# Patient Record
Sex: Female | Born: 2000 | Race: White | Hispanic: No | Smoking: Never smoker
Health system: Southern US, Community
[De-identification: ages and names within clinical notes are randomized; demographics above are authoritative.]

## PROBLEM LIST (undated history)

## (undated) DIAGNOSIS — L309 Dermatitis, unspecified: Secondary | ICD-10-CM

## (undated) DIAGNOSIS — J45909 Unspecified asthma, uncomplicated: Secondary | ICD-10-CM

## (undated) HISTORY — PX: TYMPANOSTOMY TUBE PLACEMENT: SHX32

## (undated) HISTORY — PX: ADENOIDECTOMY: SUR15

## (undated) HISTORY — PX: OTHER SURGICAL HISTORY: SHX169

## (undated) HISTORY — DX: Dermatitis, unspecified: L30.9

## (undated) HISTORY — PX: TONSILLECTOMY: SUR1361

---
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
## 2018-01-10 ENCOUNTER — Other Ambulatory Visit: Payer: Self-pay | Admitting: Women's Health

## 2018-01-31 ENCOUNTER — Ambulatory Visit (INDEPENDENT_AMBULATORY_CARE_PROVIDER_SITE_OTHER): Payer: No Typology Code available for payment source

## 2018-01-31 DIAGNOSIS — J309 Allergic rhinitis, unspecified: Secondary | ICD-10-CM | POA: Diagnosis not present

## 2018-02-07 ENCOUNTER — Ambulatory Visit (INDEPENDENT_AMBULATORY_CARE_PROVIDER_SITE_OTHER): Payer: Medicaid Other | Admitting: *Deleted

## 2018-02-07 DIAGNOSIS — J309 Allergic rhinitis, unspecified: Secondary | ICD-10-CM

## 2018-02-14 ENCOUNTER — Encounter: Payer: Self-pay | Admitting: Allergy & Immunology

## 2018-02-14 ENCOUNTER — Ambulatory Visit (INDEPENDENT_AMBULATORY_CARE_PROVIDER_SITE_OTHER): Payer: Medicaid Other | Admitting: Allergy & Immunology

## 2018-02-14 VITALS — BP 110/80 | HR 78 | Temp 97.8°F | Resp 18 | Ht 62.25 in | Wt 155.8 lb

## 2018-02-14 DIAGNOSIS — J309 Allergic rhinitis, unspecified: Secondary | ICD-10-CM

## 2018-02-14 DIAGNOSIS — J302 Other seasonal allergic rhinitis: Secondary | ICD-10-CM

## 2018-02-14 DIAGNOSIS — J3089 Other allergic rhinitis: Secondary | ICD-10-CM

## 2018-02-14 DIAGNOSIS — J4540 Moderate persistent asthma, uncomplicated: Secondary | ICD-10-CM

## 2018-02-14 MED ORDER — BECLOMETHASONE DIPROPIONATE 80 MCG/ACT IN AERS: 2.0000 | INHALATION_SPRAY | Freq: Two times a day (BID) | RESPIRATORY_TRACT | 5 refills | Status: DC

## 2018-02-14 MED ORDER — ALBUTEROL SULFATE HFA 108 (90 BASE) MCG/ACT IN AERS: 2.0000 | INHALATION_SPRAY | Freq: Four times a day (QID) | RESPIRATORY_TRACT | 1 refills | Status: DC | PRN

## 2018-02-14 NOTE — Progress Notes (Signed)
FOLLOW UP  Date of Service/Encounter:  02/14/18   Assessment:   Moderate persistent asthma, uncomplicated  Seasonal and perennial allergic rhinitis (grasses, weeds, trees, molds, dust mites, cat, dog)  [place]   Asthma Reportables:  Severity: moderate persistent  Risk: low Control: well controlled   Plan/Recommendations:   1. Moderate persistent asthma, uncomplicated - Lung testing looks great today. - We will not make any changes since you are doing so well.  - Daily controller medication(s): Qvar Redihaler 2 puffs twice daily - Prior to physical activity: ProAir 2 puffs 10-15 minutes before physical activity. - Rescue medications: ProAir 4 puffs every 4-6 hours as needed - Changes during respiratory infections or worsening symptoms: Increase Qvar to 4 puffs twice daily for TWO WEEKS. - Asthma control goals:  * Full participation in all desired activities (may need albuterol before activity) * Albuterol use two time or less a week on average (not counting use with activity) * Cough interfering with sleep two time or less a month * Oral steroids no more than once a year * No hospitalizations  2. Seasonal and perennial allergic rhinitis (grasses, weeds, trees, molds, dust mites, cat, dog) - Continue with allergy shots at the same schedule. - Try to come weekly so that we can make it up to a more efficacious dose before the spring time hits.  - Continue with Xyzal 5mg  daily (you can take an extra one on particularly bad days). - Continue with fluticasone nasal spray one spray per nostril daily. - Restart the montelukast to see if this helps with the mucous production and nasal congestion.  - Start the prednisone pack if there is no improvement in your symptoms.   3. Return in about 6 months (around 08/17/2018).   Subjective:   Zoe House is a 17 y.o. female presenting today  for follow up of  Chief Complaint  Patient presents with  . Follow-up    Asthma     Zoe House has a history of the following: Patient Active Problem List   Diagnosis Date Noted  . Moderate persistent asthma, uncomplicated 05/17/2017  . Seasonal and perennial allergic rhinitis 05/17/2017  . Asthma 11/12/2015    History obtained from: chart review and patient.  Zoe House's Primary Care Provider is Assunta Found, MD.     Zoe House is a 17 y.o. female presenting for a follow up visit.  She was last seen in February 2019 for an office visit.  At that time, her lung testing looked great.  We continued her on Qvar 80 mcg 2 puffs twice daily, increasing to 4 puffs twice daily during flares.  She was doing well on allergy shots for her seasonal and perennial allergic rhinitis.  We continued Xyzal 5 mg daily and Flonase 1 spray per nostril daily.  Since the last visit, she has done fairly well.  However, she did go several months without getting any allergy shots and remains in her blue vial. She is having some insurance coverage issues and she is checking on that. She was told that [facility] [employer] would cover her until her next birthday. She is also having quite a hard time with getting to and from both her medical visits as well as her part time job at a [employer]. She did have an easier time when she was living in [place], but she has now moved [place] a place outside of the  city between here and [place]. Now she is relying on both friends and family to drive her around.   Asthma/Respiratory Symptom History: She has done fairly well since the last visit. She reports feeling wheezy today. She is unsure what triggered this, but she recently moved in with her boyfriend. There are no new exposures but they are getting a kitten soon. The place they moved into needs some cleaning, so she thinks that this is where her wheezing is coming from. She remains on the Qvar two puffs twice daily. Zoe House's  asthma has been well controlled. She has not required rescue medication, experienced nocturnal awakenings due to lower respiratory symptoms, nor have activities of daily living been limited. She has required no Emergency Department or Urgent Care visits for her asthma. She has required zero courses of systemic steroids for asthma exacerbations since the last visit. ACT score today is 21, indicating excellent asthma symptom control.   Allergic Rhinitis Symptom History: Zoe AddisonKatie is on allergen immunotherapy. She receives two injections. Immunotherapy script #1 contains trees, weeds, grasses, cat and dog. She currently receives 0.3615mL of the BLUE vial (1/100,000). Immunotherapy script #2 contains ragweed, molds and dust mites. She currently receives 0.7515mL of the BLUE vial (1/100,000). She started shots in November of 2018 and not yet reached maintenance. She remains on Xyzal 5mg  daily. She is continuing to work on transportation, so this is an issue for her. She is planning to get rides from other people. She has looked into [employer] but she does not have the $400 for the classes/registration.     Otherwise, there have been no changes to her past medical history, surgical history, family history, or social history. She has recently moved and is getting adjusted to her current living situation. Her boyfriend works in [employer] and makes a decent living.   Review of Systems: a 14-point review of systems is pertinent for what is mentioned in HPI.  Otherwise, all other systems were negative. Constitutional: negative other than that listed in the HPI Eyes: negative other than that listed in the HPI Ears, nose, mouth, throat, and face: negative other than that listed in the HPI Respiratory: negative other than that listed in the HPI Cardiovascular: negative other than that listed in the HPI Gastrointestinal: negative other than that listed in the HPI Genitourinary: negative other than that listed in the  HPI Integument: negative other than that listed in the HPI Hematologic: negative other than that listed in the HPI Musculoskeletal: negative other than that listed in the HPI Neurological: negative other than that listed in the HPI Allergy/Immunologic: negative other than that listed in the HPI    Objective:   Blood pressure 110/80, pulse 78, temperature 97.8 F (36.6 C), temperature source Oral, resp. rate 18, height 5' 2.25" (1.581 m), weight 155 lb 12.8 oz (70.7 kg), SpO2 98 %, not currently breastfeeding. Body mass index is 28.27 kg/m.   Physical Exam:  General: Alert, interactive, in no acute distress. Pleasant female. Talkative. Shows me pictures of her adorable daughter.  Eyes: No conjunctival injection bilaterally, no discharge on the right, no discharge on the left and no Horner-Trantas dots present. PERRL bilaterally. EOMI without pain. No photophobia.  Ears: Scarring on the right TM, Right TM pearly gray with normal light reflex, Left TM pearly gray with normal light reflex, Right TM intact without perforation and Left TM intact without perforation.  Nose/Throat: External nose within normal limits, nasal crease present and septum midline. Turbinates edematous with clear discharge.  Posterior oropharynx erythematous without cobblestoning in the posterior oropharynx. Tonsils 2+ without exudates.  Tongue without thrush. Lungs: Clear to auscultation without wheezing, rhonchi or rales. No increased work of breathing. CV: Normal S1/S2. No murmurs. Capillary refill <2 seconds.  Skin: Warm and dry, without lesions or rashes. Neuro:   Grossly intact. No focal deficits appreciated. Responsive to questions.  Diagnostic studies:   Spirometry: results normal (FEV1: 2.48/112%, FVC: 3.10/116%, FEV1/FVC: 79%).    Spirometry consistent with normal pattern.   Allergy Studies: none       Malachi BondsJoel Caige Almeda, MD  [facility] [place]

## 2018-02-14 NOTE — Patient Instructions (Addendum)
1. Moderate persistent asthma, uncomplicated - Lung testing looks great today. - We will not make any changes since you are doing so well.  - Daily controller medication(s): Qvar 80mcg Redihaler 2 puffs twice daily - Prior to physical activity: ProAir 2 puffs 10-15 minutes before physical activity. - Rescue medications: ProAir 4 puffs every 4-6 hours as needed - Changes during respiratory infections or worsening symptoms: Increase Qvar 80mcg to 4 puffs twice daily for TWO WEEKS. - Asthma control goals:  * Full participation in all desired activities (may need albuterol before activity) * Albuterol use two time or less a week on average (not counting use with activity) * Cough interfering with sleep two time or less a month * Oral steroids no more than once a year * No hospitalizations  2. Seasonal and perennial allergic rhinitis (grasses, weeds, trees, molds, dust mites, cat, dog) - Continue with allergy shots at the same schedule. - Try to come weekly so that we can make it up to a more efficacious dose before the spring time hits.  - Continue with Xyzal 5mg  daily (you can take an extra one on particularly bad days). - Continue with fluticasone nasal spray one spray per nostril daily. - Restart the montelukast to see if this helps with the mucous production and nasal congestion.  - Start the prednisone pack if there is no improvement in your symptoms.   3. Return in about 6 months (around 08/17/2018).   Please inform us of any Emergency Department visits, hospitalizations, or changes in symptoms. Call us before going to the ED for breathing or allergy symptoms since we might be able to fit you in for a sick visit. Feel free to contact us anytime with any questions, problems, or concerns.  It was a pleasure to see you again today!  Websites that have reliable patient information: 1. American Academy of Asthma, Allergy, and Immunology: www.aaaai.org 2. Food Allergy Research and Education  (FARE): foodallergy.org 3. Mothers of Asthmatics: http://www.asthmacommunitynetwork.org 4. American College of Allergy, Asthma, and Immunology: www.acaai.org

## 2018-03-30 ENCOUNTER — Encounter: Payer: Self-pay | Admitting: Women's Health

## 2018-03-30 ENCOUNTER — Ambulatory Visit (INDEPENDENT_AMBULATORY_CARE_PROVIDER_SITE_OTHER): Payer: Medicaid Other | Admitting: Women's Health

## 2018-03-30 ENCOUNTER — Encounter (INDEPENDENT_AMBULATORY_CARE_PROVIDER_SITE_OTHER): Payer: Self-pay

## 2018-03-30 VITALS — BP 128/77 | HR 83 | Ht 62.0 in | Wt 156.5 lb

## 2018-03-30 DIAGNOSIS — Z309 Encounter for contraceptive management, unspecified: Secondary | ICD-10-CM | POA: Diagnosis not present

## 2018-03-30 DIAGNOSIS — J45909 Unspecified asthma, uncomplicated: Secondary | ICD-10-CM

## 2018-03-30 DIAGNOSIS — Z113 Encounter for screening for infections with a predominantly sexual mode of transmission: Secondary | ICD-10-CM | POA: Diagnosis not present

## 2018-03-30 DIAGNOSIS — Z01419 Encounter for gynecological examination (general) (routine) without abnormal findings: Secondary | ICD-10-CM | POA: Diagnosis not present

## 2018-03-30 DIAGNOSIS — Z3202 Encounter for pregnancy test, result negative: Secondary | ICD-10-CM | POA: Diagnosis not present

## 2018-03-30 DIAGNOSIS — Z3009 Encounter for other general counseling and advice on contraception: Secondary | ICD-10-CM

## 2018-03-30 DIAGNOSIS — R062 Wheezing: Secondary | ICD-10-CM

## 2018-03-30 LAB — POCT URINE PREGNANCY: Preg Test, Ur: NEGATIVE

## 2018-03-30 MED ORDER — MOMETASONE FUROATE 0.1 % EX CREA: TOPICAL_CREAM | CUTANEOUS | 5 refills | Status: DC

## 2018-03-30 MED ORDER — NORETHIN-ETH ESTRAD-FE BIPHAS 1 MG-10 MCG / 10 MCG PO TABS: 1.0000 | ORAL_TABLET | Freq: Every day | ORAL | 3 refills | Status: DC

## 2018-03-30 MED ORDER — LEVOCETIRIZINE DIHYDROCHLORIDE 5 MG PO TABS: 5.0000 mg | ORAL_TABLET | Freq: Every day | ORAL | 5 refills | Status: DC

## 2018-03-30 NOTE — Progress Notes (Signed)
   WELL-WOMAN EXAMINATION Patient name: Zoe House MRN [id number]  Date of birth: [birth year] Chief Complaint:   Annual Exam (Family Planning; needs refill on birth control)  History of Present Illness:   Zoe House is a 17 y.o. G17P1001 Caucasian female being seen today for a routine [facility] well-woman exam.  Current complaints: needs refill on COCs, [facility] just got switched to [facility] Mcaid so not able to see allergist/asthma specialist, needs refills on Xyzal and Elocon cream for eczema, was unable to get inhalers (albuterol and Qvar) as [facility] doesn't pay for them  PCP: [facility]      No LMP recorded. The current method of family planning is OCP (estrogen/progesterone) Last pap <21yo. Results were: n/a Last mammogram: never. Results were: n/a Last colonoscopy: never. Results were: n/a  Review of Systems:   Pertinent items are noted in HPI Denies any headaches, blurred vision, fatigue, shortness of breath, chest pain, abdominal pain, abnormal vaginal discharge/itching/odor/irritation, problems with periods, bowel movements, urination, or intercourse unless otherwise stated above. Pertinent History Reviewed:  Reviewed past medical,surgical, social and family history.  Reviewed problem list, medications and allergies. Physical Assessment:   Vitals:   03/30/18 1500  BP: 128/77  Pulse: 83  Weight: 156 lb 8 oz (71 kg)  Height: 5\' 2"  (1.575 m)  Body mass index is 28.62 kg/m.        Physical Examination:   General appearance - well appearing, and in no distress  Mental status - alert, oriented to person, place, and time  Psych:  She has a normal mood and affect  Skin - warm and dry, normal color, no suspicious lesions noted  Chest - effort normal, exp wheezing Lt upper & lower lobe, Rt side normal  Heart - normal rate and regular rhythm  Neck:  midline trachea, no thyromegaly or nodules  Breasts - breasts appear normal, no suspicious masses, no skin or nipple changes or  axillary  nodes  Abdomen - soft, nontender, nondistended, no masses or organomegaly  Pelvic - VULVA: normal appearing vulva with no masses, tenderness or lesions  VAGINA: normal appearing vagina with normal color and discharge, no lesions  CERVIX: normal appearing cervix without discharge or lesions, no CMT  Thin prep pap is not done   UTERUS: uterus is felt to be normal size, shape, consistency and nontender   ADNEXA: No adnexal masses or tenderness noted.  Extremities:  No swelling or varicosities noted  Results for orders placed or performed in visit on 03/30/18 (from the past 24 hour(s))  POCT urine pregnancy   Collection Time: 03/30/18  3:20 PM  Result Value Ref Range   Preg Test, Ur Negative Negative    Assessment & Plan:  1) [facility] Well-Woman Exam  2) STD screen  3) Contraception management>refilled LoLoestrin  4) Asthma w/ expiratory wheezing> Lt upper/lower lobes, gave GoodRx and Pharmacy savings card to try so she is able to get inhalers (Qvar and albuterol) filled. Also refilled her Xyzal and Elocon   Labs/procedures today: gc/ct, hiv, rpr  Mammogram @19yo  or sooner if problems Colonoscopy @17yo  or sooner if problems  Orders Placed This Encounter  Procedures  . GC/Chlamydia Probe Amp  . HIV Antibody (routine testing w rflx)  . RPR  . POCT urine pregnancy    Follow-up: Return in about 1 year (around 03/31/2019) for Physical.  Cheral Marker CNM, [facility] 03/30/2018 3:34 PM

## 2018-03-31 LAB — HIV ANTIBODY (ROUTINE TESTING W REFLEX): HIV Screen 4th Generation wRfx: NONREACTIVE

## 2018-03-31 LAB — RPR: RPR Ser Ql: NONREACTIVE

## 2018-04-01 LAB — GC/CHLAMYDIA PROBE AMP
Chlamydia trachomatis, NAA: NEGATIVE
NEISSERIA GONORRHOEAE BY PCR: NEGATIVE

## 2018-08-15 ENCOUNTER — Ambulatory Visit: Payer: Self-pay | Admitting: Allergy & Immunology

## 2018-08-16 ENCOUNTER — Ambulatory Visit: Payer: Self-pay | Admitting: Allergy & Immunology

## 2018-10-16 ENCOUNTER — Other Ambulatory Visit: Payer: Self-pay

## 2018-10-16 MED ORDER — ALBUTEROL SULFATE HFA 108 (90 BASE) MCG/ACT IN AERS: 2.0000 | INHALATION_SPRAY | Freq: Four times a day (QID) | RESPIRATORY_TRACT | 0 refills | Status: DC | PRN

## 2018-10-16 MED ORDER — FLUTICASONE PROPIONATE HFA 110 MCG/ACT IN AERO: 2.0000 | INHALATION_SPRAY | Freq: Two times a day (BID) | RESPIRATORY_TRACT | 5 refills | Status: DC

## 2018-10-16 MED ORDER — BECLOMETHASONE DIPROPIONATE 80 MCG/ACT IN AERS: 2.0000 | INHALATION_SPRAY | Freq: Two times a day (BID) | RESPIRATORY_TRACT | 0 refills | Status: DC

## 2018-10-16 NOTE — Addendum Note (Signed)
Addended by: Mliss Fritz I on: 10/16/2018 02:16 PM   Modules accepted: Orders

## 2018-10-16 NOTE — Telephone Encounter (Signed)
Patient called and finally has her MCD Back. She made a Televisit for Friday with Dr Dellis Anes. Patient is wheezing, I could hear her over the phone. She has been without her inhalers due to not having insurance. She did find an inhaler at home but she is unsure if its even the correct one to take, but its not helping her. I told her to make sure she keeps her televisit on Friday, patient agreed she would.   Please send refill to [facility].

## 2018-10-16 NOTE — Telephone Encounter (Signed)
I spoke with patient and informed her of the change in medication from Qvar to Flovent. Per MD standing orders Qvar HFA (not redihaler) can be changed to Flovent equivalent.

## 2018-10-20 ENCOUNTER — Ambulatory Visit (INDEPENDENT_AMBULATORY_CARE_PROVIDER_SITE_OTHER): Payer: Medicaid Other | Admitting: Allergy & Immunology

## 2018-10-20 ENCOUNTER — Other Ambulatory Visit: Payer: Self-pay

## 2018-10-20 ENCOUNTER — Encounter: Payer: Self-pay | Admitting: Allergy & Immunology

## 2018-10-20 DIAGNOSIS — J302 Other seasonal allergic rhinitis: Secondary | ICD-10-CM

## 2018-10-20 DIAGNOSIS — J3089 Other allergic rhinitis: Secondary | ICD-10-CM | POA: Diagnosis not present

## 2018-10-20 DIAGNOSIS — J4540 Moderate persistent asthma, uncomplicated: Secondary | ICD-10-CM | POA: Diagnosis not present

## 2018-10-20 MED ORDER — LEVOCETIRIZINE DIHYDROCHLORIDE 5 MG PO TABS: 5.0000 mg | ORAL_TABLET | Freq: Every day | ORAL | 5 refills | Status: DC

## 2018-10-20 MED ORDER — MONTELUKAST SODIUM 10 MG PO TABS: 10.0000 mg | ORAL_TABLET | Freq: Every day | ORAL | 11 refills | Status: DC

## 2018-10-20 NOTE — Patient Instructions (Signed)
1. Moderate persistent asthma, uncomplicated - We replaced the Qvar with Flovent since Medicaid only covers this now.  - Daily controller medication(s): Flovent 2 puffs twice daily with spacer - Prior to physical activity: ProAir 2 puffs 10-15 minutes before physical activity. - Rescue medications: ProAir 4 puffs every 4-6 hours as needed - Changes during respiratory infections or worsening symptoms: Increase Flovent to 4 puffs twice daily for TWO WEEKS. - Asthma control goals:  * Full participation in all desired activities (may need albuterol before activity) * Albuterol use two time or less a week on average (not counting use with activity) * Cough interfering with sleep two time or less a month * Oral steroids no more than once a year * No hospitalizations  2. Seasonal and perennial allergic rhinitis (grasses, weeds, trees, molds, dust mites, cat, dog) - Restart the montelukast since this is cheap and can help with the asthma as well as the allergies.  - Continue with Xyzal 5mg  daily (you can take an extra one on particularly bad days). - Continue with fluticasone nasal spray one spray per nostril daily.  3. No follow-ups on file. This can be an in-person, a virtual Webex or a telephone follow up visit.   Please inform us of any Emergency Department visits, hospitalizations, or changes in symptoms. Call us before going to the ED for breathing or allergy symptoms since we might be able to fit you in for a sick visit. Feel free to contact us anytime with any questions, problems, or concerns.  It was a pleasure to talk to you today today!  Websites that have reliable patient information: 1. American Academy of Asthma, Allergy, and Immunology: www.aaaai.org 2. Food Allergy Research and Education (FARE): foodallergy.org 3. Mothers of Asthmatics: http://www.asthmacommunitynetwork.org 4. American College of Allergy, Asthma, and Immunology: www.acaai.org  "Like" [place] on  Facebook and Instagram for our latest updates!      Make sure you are registered to vote! If you have moved or changed any of your contact information, you will need to get this updated before voting!    Voter ID laws are NOT going into effect for the General Election in November 2020! DO NOT let this stop you from exercising your right to vote!

## 2018-10-20 NOTE — Progress Notes (Signed)
Start:1110 AM Location home End 11:26 AM

## 2018-10-20 NOTE — Progress Notes (Signed)
RE: Zoe House MRN: [id number] DOB: 09-08-2000 Date of Telemedicine Visit: 10/20/2018  Referring provider: Assunta Found, MD Primary care provider: Assunta Found, MD  Chief Complaint: Asthma and Allergic Rhinitis    Telemedicine Follow Up Visit via Telephone: I connected with Zoe House for a follow up on 10/20/18 by telephone and verified that I am speaking with the correct person using two identifiers.   I discussed the limitations, risks, security and privacy concerns of performing an evaluation and management service by telephone and the availability of in person appointments. I also discussed with the patient that there may be a patient responsible charge related to this service. The patient expressed understanding and agreed to proceed.  Patient is at home accompanied by her two year old daughter.  Provider is at the office.  Visit start time: 11:10 AM Visit end time: 11:26 AM Insurance consent/check in by: [facility] Medical consent and medical assistant/nurse: Zoe House  History of Present Illness:  She is a 18 y.o. female, who is being followed for persistent asthma and allergic rhinitis. Her previous allergy office visit was in August 2019 with Dr. Dellis Anes.  She was last seen in August 2019.  At that time, her lung testing looked great.  We did not make any medication changes since she was doing so well.  We continued her on Qvar 80 mcg 2 puffs twice daily with albuterol as needed.  For her allergic rhinitis, we continued her with allergy shots.  Travel had been an issue, but she had access to more reliable transportation and was interested in doing the allergy shots.  We continued levo cetirizine 5 mg daily and fluticasone 1 spray per nostril daily.  We also restarted her on the montelukast.  Since last visit, she has not done very well.  Unfortunately, she lost her insurance when she was laid off in mid March.  She has now qualified for Medicaid, but she is unsure how long this  will last.  Asthma/Respiratory Symptom History: We changed her Qvar to Flovent 2 puffs twice daily due to her Medicaid coverage.  She feels much better when she started this.  She originally called around 3 days ago and was wheezing on the phone, requesting refills.  She did not need prednisone and when she started the Qvar she felt quite good.  She is not on the montelukast.  We did restart that at the last visit, but she was under the impression to remain off of it.  Allergic Rhinitis Symptom History: She is interested in restarting her allergy shots, but transportation is an issue once again.  She is also unsure of her insurance status in the coming months.  She would like a refill of her Xyzal.  She does have no spray leftover.  Again, she has not taken montelukast.  She has not required antibiotics since last visit.  Otherwise, there have been no changes to her past medical history, surgical history, family history, or social history.  She is trying to stay safe at home with her now 23-year-old daughter.  Her daughter is very advanced and is speaking in full sentences, naming colors, and meeting all of her milestones.  Assessment and Plan:  Zoe House is a 18 y.o. female with:  Moderate persistent asthma, uncomplicated  Seasonal and perennial allergic rhinitis(grasses, weeds, trees, molds, dust mites, cat, dog)    1. Moderate persistent asthma, uncomplicated - We replaced the Qvar with Flovent since Medicaid only covers this now.  - Daily controller medication(s):  Flovent 110mcg 2 puffs twice daily with spacer - Prior to physical activity: ProAir 2 puffs 10-15 minutes before physical activity. - Rescue medications: ProAir 4 puffs every 4-6 hours as needed - Changes during respiratory infections or worsening symptoms: Increase Flovent 110mcg to 4 puffs twice daily for TWO WEEKS. - Asthma control goals:  * Full participation in all desired activities (may need albuterol before activity) *  Albuterol use two time or less a week on average (not counting use with activity) * Cough interfering with sleep two time or less a month * Oral steroids no more than once a year * No hospitalizations  2. Seasonal and perennial allergic rhinitis (grasses, weeds, trees, molds, dust mites, cat, dog) - Restart the montelukast since this is cheap and can help with the asthma as well as the allergies.  - Continue with Xyzal 5mg  daily (you can take an extra one on particularly bad days). - Continue with fluticasone nasal spray one spray per nostril daily.  3. Follow up in six months or earlier if needed. This can be an in-person, a virtual Webex or a telephone follow up visit.   Diagnostics: None.  Medication List:  Current Outpatient Medications  Medication Sig Dispense Refill  . albuterol (PROVENTIL HFA) 108 (90 Base) MCG/ACT inhaler Inhale 2 puffs into the lungs every 6 (six) hours as needed. 1 Inhaler 0  . fluticasone (FLOVENT HFA) 110 MCG/ACT inhaler Inhale 2 puffs into the lungs 2 (two) times daily. 1 Inhaler 5  . levocetirizine (XYZAL) 5 MG tablet Take 1 tablet (5 mg total) by mouth daily. 30 tablet 5  . mometasone (ELOCON) 0.1 % cream APPLY TO THE AFFECTED AREAS TWICE DAILY AS NEEDED FOR ECZEMA. 45 g 5  . Norethindrone-Ethinyl Estradiol-Fe Biphas (LO LOESTRIN FE) 1 MG-10 MCG / 10 MCG tablet Take 1 tablet by mouth daily. 3 Package 3  . montelukast (SINGULAIR) 10 MG tablet Take 1 tablet (10 mg total) by mouth at bedtime for 30 days. 30 tablet 11   No current facility-administered medications for this visit.    Allergies: Allergies  Allergen Reactions  . Other Other (See Comments)    Pt states that she is allergic to trees, dogs, cats, mold, grass, dust, and 52 other outside sources.  Pt states that she is NOT allergic to any kind of medication or food.     I reviewed her past medical history, social history, family history, and environmental history and no significant changes have  been reported from previous visits.  Review of Systems  Constitutional: Negative for activity change, appetite change, chills, fatigue and fever.  HENT: Positive for congestion and postnasal drip. Negative for nosebleeds, rhinorrhea and sinus pain.   Eyes: Negative for pain, discharge, redness and itching.  Respiratory: Positive for wheezing. Negative for cough and shortness of breath.   Gastrointestinal: Negative for constipation, diarrhea and nausea.  Endocrine: Negative for cold intolerance and heat intolerance.  Allergic/Immunologic: Negative for environmental allergies and food allergies.    Objective:  Physical exam not obtained as encounter was done via telephone.   Previous notes and tests were reviewed.  I discussed the assessment and treatment plan with the patient. The patient was provided an opportunity to ask questions and all were answered. The patient agreed with the plan and demonstrated an understanding of the instructions.   The patient was advised to call back or seek an in-person evaluation if the symptoms worsen or if the condition fails to improve as anticipated.  I  provided 16 minutes of non-face-to-face time during this encounter.  It was my pleasure to participate in [place] Blucher's care today. Please feel free to contact me with any questions or concerns.   Sincerely,  Alfonse Spruce, MD

## 2019-03-01 ENCOUNTER — Telehealth: Payer: Self-pay | Admitting: Women's Health

## 2019-03-01 ENCOUNTER — Other Ambulatory Visit: Payer: Self-pay | Admitting: Advanced Practice Midwife

## 2019-03-01 ENCOUNTER — Other Ambulatory Visit: Payer: Self-pay | Admitting: Women's Health

## 2019-03-01 NOTE — Telephone Encounter (Signed)
Patient called, requesting a refill on her bc.  [place]

## 2019-03-01 NOTE — Telephone Encounter (Signed)
Pt needs a refill on her birth control. Thanks! JSY 

## 2019-04-20 ENCOUNTER — Other Ambulatory Visit: Payer: Self-pay

## 2019-04-20 ENCOUNTER — Encounter: Payer: Self-pay | Admitting: Allergy & Immunology

## 2019-04-20 ENCOUNTER — Ambulatory Visit (INDEPENDENT_AMBULATORY_CARE_PROVIDER_SITE_OTHER): Payer: Medicaid Other | Admitting: Allergy & Immunology

## 2019-04-20 VITALS — BP 108/62 | HR 74 | Temp 98.6°F | Resp 16 | Ht 63.31 in | Wt 156.2 lb

## 2019-04-20 DIAGNOSIS — J302 Other seasonal allergic rhinitis: Secondary | ICD-10-CM | POA: Diagnosis not present

## 2019-04-20 DIAGNOSIS — J3089 Other allergic rhinitis: Secondary | ICD-10-CM

## 2019-04-20 DIAGNOSIS — J4530 Mild persistent asthma, uncomplicated: Secondary | ICD-10-CM

## 2019-04-20 MED ORDER — FLOVENT HFA 110 MCG/ACT IN AERO: 2.0000 | INHALATION_SPRAY | Freq: Two times a day (BID) | RESPIRATORY_TRACT | 5 refills | Status: DC

## 2019-04-20 MED ORDER — MONTELUKAST SODIUM 10 MG PO TABS: 10.0000 mg | ORAL_TABLET | Freq: Every day | ORAL | 11 refills | Status: DC

## 2019-04-20 MED ORDER — LEVOCETIRIZINE DIHYDROCHLORIDE 5 MG PO TABS: 5.0000 mg | ORAL_TABLET | Freq: Every day | ORAL | 5 refills | Status: DC

## 2019-04-20 MED ORDER — FLUTICASONE PROPIONATE 50 MCG/ACT NA SUSP: 2.0000 | Freq: Every day | NASAL | 5 refills | Status: DC

## 2019-04-20 NOTE — Progress Notes (Signed)
FOLLOW UP  Date of Service/Encounter:  04/20/19   Assessment:   Moderate persistent asthma, uncomplicated  Seasonal and perennial allergic rhinitis(grasses, weeds, trees, molds, dust mites, cat, dog)  Plan/Recommendations:   1. Moderate persistent asthma, uncomplicated - Lung testing looks great today. - Daily controller medication(s): Flovent 2 puffs twice daily with spacer - Prior to physical activity: ProAir 2 puffs 10-15 minutes before physical activity. - Rescue medications: ProAir 4 puffs every 4-6 hours as needed - Changes during respiratory infections or worsening symptoms: Increase Flovent to 4 puffs twice daily for TWO WEEKS. - Asthma control goals:  * Full participation in all desired activities (may need albuterol before activity) * Albuterol use two time or less a week on average (not counting use with activity) * Cough interfering with sleep two time or less a month * Oral steroids no more than once a year * No hospitalizations  2. Seasonal and perennial allergic rhinitis (grasses, weeds, trees, molds, dust mites, cat, dog) - Continue with montelukast.   - Continue with Xyzal 5mg  daily (you can take an extra one on particularly bad days). - Continue with fluticasone nasal spray one spray per nostril daily. - Make an appointment to restart your allergy shots in two weeks.   3. Return in about 6 months (around 10/19/2019). This can be an in-person, a virtual Webex or a telephone follow up visit.   Subjective:   Zoe House is a 18 y.o. female presenting today for follow up of  Chief Complaint  Patient presents with  . Allergic Rhinitis     doing good refill on xyzal    Zoe House has a history of the following: Patient Active Problem List   Diagnosis Date Noted  . Moderate persistent asthma, uncomplicated 05/17/2017  . Seasonal and perennial allergic rhinitis 05/17/2017  . Asthma 11/12/2015    History obtained from: chart  review and patient.  Zoe House is a 18 y.o. female presenting for a follow up visit.  Last spoke in April 2020 via telephone visit.  At that time, we replaced her Qvar with Flovent.  We prescribed Flovent 110 mcg 2 puffs twice daily with a spacer.  We also continued albuterol as needed.  For her allergic rhinitis, we restarted the montelukast.  We continued Xyzal 5 mg daily as well as Flonase 1 spray per nostril daily.  Since last visit, she has done well. 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 They are having a hard time at home, due mostly to the fact that her boyfriend's work has decreased. He apparently puts on stone work at [employer]. She is fortunate to be able to qualify for Medicaid, however, so while she has it she would like to start her allergy shots once again. Transportation has been an issue in the past, but this is no longer a problem.   Asthma/Respiratory Symptom History: She remains on the Flovent BID. She does have the albuterol which she is not using very often at all. Typically she does not need to use it. She used it prior to getting her wisdom teeth removed last week.   Allergic Rhinitis Symptom History: She is using the montelukast daily. She is interested in starting allergy shots. She is on amoxicillin because of her wisdom tooth removal.   Otherwise, there have been no changes to her past medical history, surgical history, family history, or social history.  Review of Systems  Constitutional: Negative.  Negative for chills, fever, malaise/fatigue and weight loss.  HENT: Negative.  Negative for congestion, ear discharge and ear pain.   Eyes: Negative for pain, discharge and redness.  Respiratory: Positive for cough and sputum production. Negative for shortness of breath and wheezing.   Cardiovascular: Negative.  Negative for chest pain and palpitations.  Gastrointestinal: Negative for  abdominal pain, heartburn, nausea and vomiting.  Skin: Negative.  Negative for itching and rash.  Neurological: Negative for dizziness and headaches.  Endo/Heme/Allergies: Positive for environmental allergies. Does not bruise/bleed easily.       Objective:   Blood pressure 108/62, pulse 74, temperature 98.6 F (37 C), temperature source Temporal, resp. rate 16, height 5' 3.31" (1.608 m), weight 156 lb 3.2 oz (70.9 kg), SpO2 97 %. Body mass index is 27.4 kg/m.   Physical Exam:  Physical Exam  Constitutional: She appears well-developed.  HENT:  Head: Normocephalic and atraumatic.  Right Ear: Tympanic membrane, external ear and ear canal normal.  Left Ear: Tympanic membrane, external ear and ear canal normal.  Nose: No mucosal edema, rhinorrhea, nasal deformity or septal deviation. No epistaxis. Right sinus exhibits no maxillary sinus tenderness and no frontal sinus tenderness. Left sinus exhibits no maxillary sinus tenderness and no frontal sinus tenderness.  Mouth/Throat: Uvula is midline and oropharynx is clear and moist. Mucous membranes are not pale and not dry.  Turbinate hypertrophy with clear discharge. There is no epistaxis noted.   Eyes: Pupils are equal, round, and reactive to light. Conjunctivae and EOM are normal. Right eye exhibits no chemosis and no discharge. Left eye exhibits no chemosis and no discharge. Right conjunctiva is not injected. Left conjunctiva is not injected.  Cardiovascular: Normal rate, regular rhythm and normal heart sounds.  Respiratory: Effort normal and breath sounds normal. No accessory muscle usage. No tachypnea. No respiratory distress. She has no wheezes. She has no rhonchi. She has no rales. She exhibits no tenderness.  Moving air well in all lung fields. No increased work of breathing noted.   Lymphadenopathy:    She has no cervical adenopathy.  Neurological: She is alert.  Skin: No abrasion, no petechiae and no rash noted. Rash is not  papular, not vesicular and not urticarial. No erythema. No pallor.  No eczematous or urticarial lesions noted.   Psychiatric: She has a normal mood and affect.     Diagnostic studies:    Spirometry: results normal (FEV1: 2.28/72%, FVC: 2.85/73%, FEV1/FVC: 79%).    Spirometry consistent with normal pattern.   Allergy Studies: none      Salvatore Marvel, MD  [facility] [employer] [facility] [employer]

## 2019-04-20 NOTE — Patient Instructions (Addendum)
1. Moderate persistent asthma, uncomplicated - Lung testing looks great today. - Daily controller medication(s): Flovent 134mcg 2 puffs twice daily with spacer - Prior to physical activity: ProAir 2 puffs 10-15 minutes before physical activity. - Rescue medications: ProAir 4 puffs every 4-6 hours as needed - Changes during respiratory infections or worsening symptoms: Increase Flovent 128mcg to 4 puffs twice daily for TWO WEEKS. - Asthma control goals:  * Full participation in all desired activities (may need albuterol before activity) * Albuterol use two time or less a week on average (not counting use with activity) * Cough interfering with sleep two time or less a month * Oral steroids no more than once a year * No hospitalizations  2. Seasonal and perennial allergic rhinitis (grasses, weeds, trees, molds, dust mites, cat, dog) - Continue with montelukast.   - Continue with Xyzal 5mg  daily (you can take an extra one on particularly bad days). - Continue with fluticasone nasal spray one spray per nostril daily. - Make an appointment to restart your allergy shots in two weeks.   3. Return in about 6 months (around 10/19/2019). This can be an in-person, a virtual Webex or a telephone follow up visit.   Please inform us of any Emergency Department visits, hospitalizations, or changes in symptoms. Call us before going to the ED for breathing or allergy symptoms since we might be able to fit you in for a sick visit. Feel free to contact us anytime with any questions, problems, or concerns.  It was a pleasure to talk to you today today!  Websites that have reliable patient information: 1. American Academy of Asthma, Allergy, and Immunology: www.aaaai.org 2. Food Allergy Research and Education (FARE): foodallergy.org 3. Mothers of Asthmatics: http://www.asthmacommunitynetwork.org 4. American College of Allergy, Asthma, and Immunology: www.acaai.org  "Like" [place] on Facebook and Instagram for  our latest updates!      Make sure you are registered to vote! If you have moved or changed any of your contact information, you will need to get this updated before voting!    Voter ID laws are NOT going into effect for the General Election in November 2020! DO NOT let this stop you from exercising your right to vote!

## 2019-04-23 DIAGNOSIS — J301 Allergic rhinitis due to pollen: Secondary | ICD-10-CM | POA: Diagnosis not present

## 2019-04-23 NOTE — Progress Notes (Signed)
VIALS EXP 04-22-20 

## 2019-04-25 ENCOUNTER — Telehealth: Payer: Self-pay | Admitting: *Deleted

## 2019-04-25 NOTE — Telephone Encounter (Signed)
Pt had wisdom teeth pulled and is on amoxicillin, has yeast, try monistat first, and if not better make appt.

## 2019-04-25 NOTE — Telephone Encounter (Signed)
Patient on abx for wisdom teeth. Has yeast infection. Can we send something in for her?

## 2019-05-04 ENCOUNTER — Other Ambulatory Visit: Payer: Self-pay

## 2019-05-04 ENCOUNTER — Ambulatory Visit (INDEPENDENT_AMBULATORY_CARE_PROVIDER_SITE_OTHER): Payer: Medicaid Other

## 2019-05-04 DIAGNOSIS — J309 Allergic rhinitis, unspecified: Secondary | ICD-10-CM

## 2019-05-04 MED ORDER — EPINEPHRINE 0.3 MG/0.3ML IJ SOAJ: 0.3000 mg | Freq: Once | INTRAMUSCULAR | 1 refills | Status: DC | PRN

## 2019-05-04 NOTE — Progress Notes (Signed)
Immunotherapy   Patient Details  Name: [name] MRN: [id number] Date of Birth: 2001/02/15  05/04/2019  Otilio Connors started injections for  MOLD-DM-RW & G-W-T-C-D Following schedule: B  Frequency:2 times per week Epi-Pen:Epi-Pen Available  Consent signed and patient instructions given. Patient waited 30 minutes in office post injection with no local or systemic reactions.    [name] 05/04/2019, 10:35 AM

## 2019-05-11 ENCOUNTER — Ambulatory Visit (INDEPENDENT_AMBULATORY_CARE_PROVIDER_SITE_OTHER): Payer: Medicaid Other

## 2019-05-11 DIAGNOSIS — J309 Allergic rhinitis, unspecified: Secondary | ICD-10-CM

## 2019-05-21 ENCOUNTER — Other Ambulatory Visit: Payer: Self-pay | Admitting: Family Medicine

## 2019-05-21 MED ORDER — MOMETASONE FUROATE 0.1 % EX CREA: TOPICAL_CREAM | CUTANEOUS | 5 refills | Status: DC

## 2019-05-28 ENCOUNTER — Telehealth: Payer: Self-pay | Admitting: Women's Health

## 2019-05-28 NOTE — Telephone Encounter (Signed)
Unable to reach pt with restrictions.  

## 2019-05-29 ENCOUNTER — Ambulatory Visit: Payer: Self-pay | Admitting: Women's Health

## 2019-05-30 ENCOUNTER — Encounter: Payer: Self-pay | Admitting: Medical

## 2019-05-30 ENCOUNTER — Ambulatory Visit (INDEPENDENT_AMBULATORY_CARE_PROVIDER_SITE_OTHER): Payer: Medicaid Other | Admitting: Medical

## 2019-05-30 ENCOUNTER — Other Ambulatory Visit: Payer: Self-pay

## 2019-05-30 VITALS — BP 120/82 | HR 80 | Ht 62.0 in | Wt 158.0 lb

## 2019-05-30 DIAGNOSIS — Z3009 Encounter for other general counseling and advice on contraception: Secondary | ICD-10-CM | POA: Diagnosis not present

## 2019-05-30 MED ORDER — LO LOESTRIN FE 1 MG-10 MCG / 10 MCG PO TABS: 1.0000 | ORAL_TABLET | Freq: Every day | ORAL | 4 refills | Status: DC

## 2019-05-30 NOTE — Patient Instructions (Signed)

## 2019-05-30 NOTE — Progress Notes (Signed)
History:  Zoe House is a 18 y.o. G1P1001 who presents to clinic today for birth control refill. The patient is too young for a pap smear this year and declines STD testing. She would like to wait for the flu shot since she feels she is getting a cold. Her last full physical was 03/30/18. She states that the OCPs are working well for her. She has regular 4 day periods that are light with minimal cramping. She would like to continue.    The following portions of the patient's history were reviewed and updated as appropriate: allergies, current medications, family history, past medical history, social history, past surgical history and problem list.  Review of Systems:  Review of Systems  Constitutional: Negative for fever and malaise/fatigue.  Gastrointestinal: Negative for abdominal pain.  Genitourinary: Negative for dysuria, frequency and urgency.       Neg - vaginal bleeding, discharge      Objective:  Physical Exam BP 120/82 (BP Location: Left Arm, Patient Position: Sitting, Cuff Size: Normal)   Pulse 80   Ht 5\' 2"  (1.575 m)   Wt 158 lb (71.7 kg)   LMP 04/30/2019   BMI 28.90 kg/m  Physical Exam  Vitals reviewed. Constitutional: She is oriented to person, place, and time. She appears well-developed and well-nourished. No distress.  HENT:  Head: Normocephalic and atraumatic.  Cardiovascular: Normal rate, regular rhythm and normal heart sounds.  No murmur heard. Respiratory: Effort normal and breath sounds normal. No respiratory distress. She has no wheezes.  GI: Soft. Bowel sounds are normal. She exhibits no distension and no mass. There is no abdominal tenderness. There is no rebound and no guarding.  Neurological: She is alert and oriented to person, place, and time.  Skin: Skin is warm and dry. No erythema.  Psychiatric: She has a normal mood and affect.    Assessment & Plan:  1. Family planning - Norethindrone-Ethinyl Estradiol-Fe Biphas (LO LOESTRIN FE) 1 MG-10 MCG  / 10 MCG tablet; Take 1 tablet by mouth daily.  Dispense: 84 tablet; Refill: 4  Patient advised to follow-up in 1 year for annual exam with pap smear Advised to call if she would like RN visit for flu shot or can also go to PCP  Luvenia Redden, PA-C 05/30/2019 3:39 PM

## 2019-06-13 ENCOUNTER — Ambulatory Visit (INDEPENDENT_AMBULATORY_CARE_PROVIDER_SITE_OTHER): Payer: Medicaid Other | Admitting: *Deleted

## 2019-06-13 DIAGNOSIS — J309 Allergic rhinitis, unspecified: Secondary | ICD-10-CM

## 2019-06-22 ENCOUNTER — Ambulatory Visit (INDEPENDENT_AMBULATORY_CARE_PROVIDER_SITE_OTHER): Payer: Medicaid Other

## 2019-06-22 DIAGNOSIS — J309 Allergic rhinitis, unspecified: Secondary | ICD-10-CM | POA: Diagnosis not present

## 2019-07-04 ENCOUNTER — Ambulatory Visit (INDEPENDENT_AMBULATORY_CARE_PROVIDER_SITE_OTHER): Payer: Medicaid Other

## 2019-07-04 DIAGNOSIS — J309 Allergic rhinitis, unspecified: Secondary | ICD-10-CM | POA: Diagnosis not present

## 2019-07-20 ENCOUNTER — Ambulatory Visit (INDEPENDENT_AMBULATORY_CARE_PROVIDER_SITE_OTHER): Payer: Medicaid Other

## 2019-07-20 DIAGNOSIS — J309 Allergic rhinitis, unspecified: Secondary | ICD-10-CM | POA: Diagnosis not present

## 2019-08-01 ENCOUNTER — Ambulatory Visit (INDEPENDENT_AMBULATORY_CARE_PROVIDER_SITE_OTHER): Payer: Medicaid Other

## 2019-08-01 DIAGNOSIS — J309 Allergic rhinitis, unspecified: Secondary | ICD-10-CM | POA: Diagnosis not present

## 2019-08-05 DIAGNOSIS — K529 Noninfective gastroenteritis and colitis, unspecified: Secondary | ICD-10-CM | POA: Diagnosis not present

## 2019-08-06 ENCOUNTER — Ambulatory Visit
Admission: EM | Admit: 2019-08-06 | Discharge: 2019-08-06 | Disposition: A | Discharge status: Home / Self Care | Payer: Medicaid Other

## 2019-08-06 ENCOUNTER — Other Ambulatory Visit: Payer: Self-pay

## 2019-08-06 DIAGNOSIS — Z20822 Contact with and (suspected) exposure to covid-19: Secondary | ICD-10-CM

## 2019-08-06 DIAGNOSIS — R112 Nausea with vomiting, unspecified: Secondary | ICD-10-CM

## 2019-08-06 MED ORDER — ONDANSETRON HCL 4 MG PO TABS: 4.0000 mg | ORAL_TABLET | Freq: Four times a day (QID) | ORAL | 0 refills | Status: DC

## 2019-08-06 NOTE — ED Provider Notes (Signed)
[place]   [id number] 08/06/19 Arrival Time: 19   CC: COVID symptoms  SUBJECTIVE: History from: patient.  Zoe House is a 19 y.o. female hx significant for asthma, who presents with improving nausea, 3 episodes of vomiting (now resolved), and several episodes of watery diarrhea (now resolved) that began 1 day ago.  Went to ED at [facility] and was treated for symptoms, but did not have a COVID test.  Denies known sick exposure to COVID, flu or strep.  Daughter with similar symptoms, had a rapid COVID this am and was negative.  Denies recent travel. Denies changes in diet, or eating anything unusual.  Was worse with eating, but tolerating food and liquids now without difficulty.  Denies previous symptoms in the past.   Denies fever, chills, fatigue, congestion, rhinorrhea, sore throat, cough, SOB, wheezing, chest pain, changes in bladder habits.     ROS: As per HPI.  All other pertinent ROS negative.     Past Medical History:  Diagnosis Date  . Asthma   . Eczema    Past Surgical History:  Procedure Laterality Date  . ADENOIDECTOMY    . TONSILLECTOMY    . tubes in ears    . TYMPANOSTOMY TUBE PLACEMENT     Allergies  Allergen Reactions  . Other Other (See Comments)    Pt states that she is allergic to trees, dogs, cats, mold, grass, dust, and 52 other outside sources.  Pt states that she is NOT allergic to any kind of medication or food.     No current facility-administered medications on file prior to encounter.   Current Outpatient Medications on File Prior to Encounter  Medication Sig Dispense Refill  . diphenoxylate-atropine (LOMOTIL) 2.5-0.025 MG tablet Take by mouth 4 (four) times daily as needed for diarrhea or loose stools.    . fluticasone (FLONASE) 50 MCG/ACT nasal spray Place 2 sprays into both nostrils daily. 1 g 5  . fluticasone (FLOVENT HFA) 110 MCG/ACT inhaler Inhale 2 puffs into the lungs 2 (two) times daily. 1 Inhaler 5  . levocetirizine (XYZAL)  5 MG tablet Take 1 tablet (5 mg total) by mouth daily. 30 tablet 5  . mometasone (ELOCON) 0.1 % cream APPLY TO THE AFFECTED AREAS TWICE DAILY AS NEEDED FOR ECZEMA. 45 g 5  . montelukast (SINGULAIR) 10 MG tablet Take 1 tablet (10 mg total) by mouth at bedtime. 30 tablet 11  . Norethindrone-Ethinyl Estradiol-Fe Biphas (LO LOESTRIN FE) 1 MG-10 MCG / 10 MCG tablet Take 1 tablet by mouth daily. 84 tablet 4  . [DISCONTINUED] albuterol (PROVENTIL HFA) 108 (90 Base) MCG/ACT inhaler Inhale 2 puffs into the lungs every 6 (six) hours as needed. (Patient not taking: Reported on 05/30/2019) 1 Inhaler 0   Social History   Socioeconomic History  . Marital status: Single    Spouse name: Not on file  . Number of children: Not on file  . Years of education: Not on file  . Highest education level: Not on file  Occupational History  . Not on file  Tobacco Use  . Smoking status: Never Smoker  . Smokeless tobacco: Never Used  Substance and Sexual Activity  . Alcohol use: No    Alcohol/week: 0.0 standard drinks  . Drug use: No  . Sexual activity: Yes    Birth control/protection: Pill  Other Topics Concern  . Not on file  Social History Narrative  . Not on file   Social Determinants of Health   Financial Resource Strain:   .  Difficulty of Paying Living Expenses: Not on file  Food Insecurity:   . Worried About Programme researcher, broadcasting/film/video in the Last Year: Not on file  . Ran Out of Food in the Last Year: Not on file  Transportation Needs:   . Lack of Transportation (Medical): Not on file  . Lack of Transportation (Non-Medical): Not on file  Physical Activity:   . Days of Exercise per Week: Not on file  . Minutes of Exercise per Session: Not on file  Stress:   . Feeling of Stress : Not on file  Social Connections:   . Frequency of Communication with Friends and Family: Not on file  . Frequency of Social Gatherings with Friends and Family: Not on file  . Attends Religious Services: Not on file  . Active  Member of Clubs or Organizations: Not on file  . Attends Banker Meetings: Not on file  . Marital Status: Not on file  Intimate Partner Violence:   . Fear of Current or Ex-Partner: Not on file  . Emotionally Abused: Not on file  . Physically Abused: Not on file  . Sexually Abused: Not on file   Family History  Problem Relation Age of Onset  . Healthy Mother   . Arthritis Maternal Grandmother     OBJECTIVE:  Vitals:   08/06/19 1242  BP: 108/75  Pulse: 89  Temp: 98.3 F (36.8 C)  TempSrc: Oral  SpO2: 97%     General appearance: alert; well-appearing, nontoxic; speaking in full sentences and tolerating own secretions HEENT: NCAT; Ears: EACs clear, TMs pearly gray; Eyes: PERRL.  EOM grossly intact. Nose: nares patent without rhinorrhea, Throat: oropharynx clear, tonsils non erythematous or enlarged, uvula midline  Neck: supple without LAD Lungs: unlabored respirations, symmetrical air entry; cough: absent; no respiratory distress; CTAB Heart: regular rate and rhythm.  Skin: warm and dry Psychological: alert and cooperative; normal mood and affect  ASSESSMENT & PLAN:  1. Suspected COVID-19 virus infection   2. Non-intractable vomiting with nausea, unspecified vomiting type     Meds ordered this encounter  Medications  . ondansetron (ZOFRAN) 4 MG tablet    Sig: Take 1 tablet (4 mg total) by mouth every 6 (six) hours.    Dispense:  12 tablet    Refill:  0    Order Specific Question:   Supervising Provider    Answer:   Eustace Moore [[id number]]   COVID testing ordered.  It will take between 5-7 days for test results.  Someone will contact you regarding abnormal results.    In the meantime: You should remain isolated in your home for 10 days from symptom onset AND greater than 72 hours after symptoms resolution (absence of fever without the use of fever-reducing medication and improvement in respiratory symptoms), whichever is longer Get plenty of rest and  push fluids Zofran prescribed.  Take as needed for nausea and vomiting Progress diet as tolerating.  Avoid fatty, or greasy foods or anything that does not agree with you Call or go to the ED if you have any new or worsening symptoms such as fever, cough, shortness of breath, chest tightness, chest pain, turning blue, changes in mental status, abdominal pain, persistent nausea/ vomiting, etc...   Reviewed expectations re: course of current medical issues. Questions answered. Outlined signs and symptoms indicating need for more acute intervention. Patient verbalized understanding. After Visit Summary given.         Rennis Harding, PA-C 08/06/19 1309

## 2019-08-06 NOTE — ED Triage Notes (Signed)
Pt presents to UC w/ for covid test. Pt has nausea, vomiting this am at 0300 but none since then. Pt was seen at hospital and treated for these symptoms, but they did not do a covid test.

## 2019-08-06 NOTE — Discharge Instructions (Signed)
COVID testing ordered.  It will take between 5-7 days for test results.  Someone will contact you regarding abnormal results.    In the meantime: You should remain isolated in your home for 10 days from symptom onset AND greater than 72 hours after symptoms resolution (absence of fever without the use of fever-reducing medication and improvement in respiratory symptoms), whichever is longer Get plenty of rest and push fluids Zofran prescribed.  Take as needed for nausea and vomiting Progress diet as tolerating.  Avoid fatty, or greasy foods or anything that does not agree with you Call or go to the ED if you have any new or worsening symptoms such as fever, cough, shortness of breath, chest tightness, chest pain, turning blue, changes in mental status, abdominal pain, persistent nausea/ vomiting, etc..Marland Kitchen

## 2019-08-07 LAB — NOVEL CORONAVIRUS, NAA: SARS-CoV-2, NAA: NOT DETECTED

## 2019-08-22 ENCOUNTER — Ambulatory Visit (INDEPENDENT_AMBULATORY_CARE_PROVIDER_SITE_OTHER): Payer: Medicaid Other

## 2019-08-22 ENCOUNTER — Telehealth: Payer: Self-pay

## 2019-08-22 DIAGNOSIS — J309 Allergic rhinitis, unspecified: Secondary | ICD-10-CM | POA: Diagnosis not present

## 2019-08-22 MED ORDER — ALBUTEROL SULFATE (2.5 MG/3ML) 0.083% IN NEBU: 2.5000 mg | INHALATION_SOLUTION | RESPIRATORY_TRACT | 1 refills | Status: DC | PRN

## 2019-08-22 NOTE — Telephone Encounter (Signed)
Patient requested Nebulizer solution. Put it into her pharmacy and she has a follow up visit.

## 2019-08-29 ENCOUNTER — Ambulatory Visit (INDEPENDENT_AMBULATORY_CARE_PROVIDER_SITE_OTHER): Payer: Medicaid Other

## 2019-08-29 DIAGNOSIS — J309 Allergic rhinitis, unspecified: Secondary | ICD-10-CM

## 2019-09-05 ENCOUNTER — Ambulatory Visit (INDEPENDENT_AMBULATORY_CARE_PROVIDER_SITE_OTHER): Payer: Medicaid Other

## 2019-09-05 DIAGNOSIS — J309 Allergic rhinitis, unspecified: Secondary | ICD-10-CM

## 2019-09-12 ENCOUNTER — Ambulatory Visit (INDEPENDENT_AMBULATORY_CARE_PROVIDER_SITE_OTHER): Payer: Medicaid Other

## 2019-09-12 DIAGNOSIS — J309 Allergic rhinitis, unspecified: Secondary | ICD-10-CM | POA: Diagnosis not present

## 2019-09-19 ENCOUNTER — Ambulatory Visit (INDEPENDENT_AMBULATORY_CARE_PROVIDER_SITE_OTHER): Payer: Medicaid Other

## 2019-09-19 DIAGNOSIS — J309 Allergic rhinitis, unspecified: Secondary | ICD-10-CM

## 2019-09-28 ENCOUNTER — Ambulatory Visit (INDEPENDENT_AMBULATORY_CARE_PROVIDER_SITE_OTHER): Payer: Medicaid Other

## 2019-09-28 DIAGNOSIS — J309 Allergic rhinitis, unspecified: Secondary | ICD-10-CM | POA: Diagnosis not present

## 2019-10-03 ENCOUNTER — Ambulatory Visit (INDEPENDENT_AMBULATORY_CARE_PROVIDER_SITE_OTHER): Payer: Medicaid Other

## 2019-10-03 DIAGNOSIS — J309 Allergic rhinitis, unspecified: Secondary | ICD-10-CM

## 2019-10-19 ENCOUNTER — Ambulatory Visit (INDEPENDENT_AMBULATORY_CARE_PROVIDER_SITE_OTHER): Payer: Medicaid Other | Admitting: Family Medicine

## 2019-10-19 ENCOUNTER — Encounter: Payer: Self-pay | Admitting: Family Medicine

## 2019-10-19 ENCOUNTER — Ambulatory Visit: Payer: Medicaid Other | Admitting: Allergy & Immunology

## 2019-10-19 ENCOUNTER — Other Ambulatory Visit: Payer: Self-pay

## 2019-10-19 VITALS — BP 112/78 | HR 68 | Temp 97.4°F | Resp 16

## 2019-10-19 DIAGNOSIS — J309 Allergic rhinitis, unspecified: Secondary | ICD-10-CM

## 2019-10-19 DIAGNOSIS — J3089 Other allergic rhinitis: Secondary | ICD-10-CM | POA: Diagnosis not present

## 2019-10-19 DIAGNOSIS — J302 Other seasonal allergic rhinitis: Secondary | ICD-10-CM | POA: Diagnosis not present

## 2019-10-19 DIAGNOSIS — J4541 Moderate persistent asthma with (acute) exacerbation: Secondary | ICD-10-CM

## 2019-10-19 MED ORDER — EPINEPHRINE 0.3 MG/0.3ML IJ SOAJ: 0.3000 mg | INTRAMUSCULAR | 1 refills | Status: DC | PRN

## 2019-10-19 NOTE — Progress Notes (Signed)
[address] [place] [address] Dept: [phone]  FOLLOW UP NOTE  Patient ID: Zoe House, female    DOB: [birth year]  Age: 19 y.o. MRN: [id number] Date of Office Visit: 10/19/2019  Assessment  Chief Complaint: No problems and Follow-up  HPI Zoe House is a 19 year old female who presents to the clinic today for follow-up visit.  She was last seen in this clinic on 04/20/2019 by Dr. Dellis Anes for evaluation of asthma and allergic rhinitis.  At today's visit she reports her asthma has been moderately well controlled with wheezing that occurred a couple of weeks ago and has resolved as well as occasional dry cough occurring at nighttime.  She continues montelukast 10 mg once a day, Flovent 2 puffs once a day with a spacer, and albuterol 2 times since her last visit to this clinic.  She denies reflux and is not taking any medication for reflux at this time.  Allergic rhinitis is reported as moderately well controlled with this frequent sneezing.  She continues Xyzal 5 mg once a day and Flonase as needed with relief of symptoms.  She continues allergen immunotherapy with no large local reactions.  She reports her symptoms of allergic rhinitis have been significantly reduced while continuing on allergen immunotherapy.  Atopic dermatitis is reported as moderately well controlled with a daily moisturizer and Elocon as needed.  She reports her eczema flares most during weather changes.  Her current medications are listed in the chart.   Drug Allergies:  Allergies  Allergen Reactions  . Other Other (See Comments)    Pt states that she is allergic to trees, dogs, cats, mold, grass, dust, and 52 other outside sources.  Pt states that she is NOT allergic to any kind of medication or food.      Physical Exam: BP 112/78 (BP Location: Left Arm, Patient Position: Sitting, Cuff Size: Normal)   Pulse 68   Temp (!) 97.4 F (36.3 C) (Temporal)   Resp 16   SpO2 95%    Physical  Exam Vitals reviewed.  Constitutional:      Appearance: Normal appearance.  HENT:     Head: Normocephalic and atraumatic.     Right Ear: Tympanic membrane normal.     Left Ear: Tympanic membrane normal.     Nose:     Comments: Bilateral nares normal.  Pharynx normal.  Ears normal.  Eyes normal.    Mouth/Throat:     Pharynx: Oropharynx is clear.  Eyes:     Conjunctiva/sclera: Conjunctivae normal.  Cardiovascular:     Rate and Rhythm: Normal rate and regular rhythm.     Heart sounds: Normal heart sounds. No murmur.  Pulmonary:     Effort: Pulmonary effort is normal.     Breath sounds: Normal breath sounds.     Comments: Lungs clear to auscultation Musculoskeletal:        General: Normal range of motion.     Cervical back: Normal range of motion and neck supple.  Skin:    General: Skin is warm and dry.  Neurological:     Mental Status: She is alert and oriented to person, place, and time.  Psychiatric:        Mood and Affect: Mood normal.        Behavior: Behavior normal.        Thought Content: Thought content normal.        Judgment: Judgment normal.     Diagnostics: FVC 3.11, FEV1 2.52.  Predicted FVC 3.52, predicted FEV1 3.12.  Spirometry indicates normal ventilatory function.  Assessment and Plan: 1. Moderate persistent asthma with acute exacerbation   2. Allergic rhinitis, unspecified seasonality, unspecified trigger   3. Seasonal and perennial allergic rhinitis     Meds ordered this encounter  Medications  . EPINEPHrine (EPIPEN 2-PAK) 0.3 mg/0.3 mL IJ SOAJ injection    Sig: Inject 0.3 mLs (0.3 mg total) into the muscle as needed for anaphylaxis.    Dispense:  2 each    Refill:  1    Patient Instructions  1. Moderate persistent asthma, uncomplicated - Lung testing looks good today. There was a slight wheeze today. Lets increase Flovent 110-to 2 puffs twice a day for the next 2 weeks - Daily controller medication(s): Flovent 164mcg 2 puffs twice daily with  spacer for 2 weeks. If you are cough and wheeze free at that time, decrease Flovent to 2 puffs once a day. - Prior to physical activity: ProAir 2 puffs 10-15 minutes before physical activity. - Rescue medications: ProAir 4 puffs every 4-6 hours as needed - Changes during respiratory infections or worsening symptoms: Increase Flovent 176mcg to 4 puffs twice daily for TWO WEEKS. - Asthma control goals:  * Full participation in all desired activities (may need albuterol before activity) * Albuterol use two time or less a week on average (not counting use with activity) * Cough interfering with sleep two time or less a month * Oral steroids no more than once a year * No hospitalizations  2. Seasonal and perennial allergic rhinitis (grasses, weeds, trees, molds, dust mites, cat, dog) - Continue with montelukast.   - Continue with Xyzal 5mg  daily (you can take an extra one on particularly bad days). - Continue with fluticasone nasal spray one spray per nostril daily. - Continue allergy injections once a week and have access to EpiPen   3. Eczema - Continue a daily moisturizing routine - Continue Elecon 0.1% cream to red, itchy areas once a day as needed for red, itchy areas  4.  Follow up in 6 months or sooner if needed This can be an in-person, a virtual Webex or a telephone follow up visit.    Return in about 6 months (around 04/19/2020), or if symptoms worsen or fail to improve.    Thank you for the opportunity to care for this patient.  Please do not hesitate to contact me with questions.  Gareth Morgan, FNP [facility]

## 2019-10-19 NOTE — Patient Instructions (Addendum)
1. Moderate persistent asthma, uncomplicated - Lung testing looks good today. There was a slight wheeze today. Lets increase Flovent 110-to 2 puffs twice a day for the next 2 weeks - Daily controller medication(s): Flovent 2 puffs twice daily with spacer for 2 weeks. If you are cough and wheeze free at that time, decrease Flovent to 2 puffs once a day. - Prior to physical activity: ProAir 2 puffs 10-15 minutes before physical activity. - Rescue medications: ProAir 4 puffs every 4-6 hours as needed - Changes during respiratory infections or worsening symptoms: Increase Flovent to 4 puffs twice daily for TWO WEEKS. - Asthma control goals:  * Full participation in all desired activities (may need albuterol before activity) * Albuterol use two time or less a week on average (not counting use with activity) * Cough interfering with sleep two time or less a month * Oral steroids no more than once a year * No hospitalizations  2. Seasonal and perennial allergic rhinitis (grasses, weeds, trees, molds, dust mites, cat, dog) - Continue with montelukast.   - Continue with Xyzal 5mg  daily (you can take an extra one on particularly bad days). - Continue with fluticasone nasal spray one spray per nostril daily. - Continue allergy injections once a week and have access to EpiPen   3. Eczema - Continue a daily moisturizing routine - Continue Elecon 0.1% cream to red, itchy areas once a day as needed for red, itchy areas  4.  Follow up in 6 months or sooner if needed This can be an in-person, a virtual Webex or a telephone follow up visit.   Please inform of any Emergency Department visits, hospitalizations, or changes in symptoms. Call us before going to the ED for breathing or allergy symptoms since we might be able to fit you in for a sick visit. Feel free to contact us anytime with any questions, problems, or concerns.  It was a pleasure to talk to you today today!  Websites that  have reliable patient information: 1. American Academy of Asthma, Allergy, and Immunology: www.aaaai.org 2. Food Allergy Research and Education (FARE): foodallergy.org 3. Mothers of Asthmatics: http://www.asthmacommunitynetwork.org 4. American College of Allergy, Asthma, and Immunology: www.acaai.org  "Like" [place] on Facebook and Instagram for our latest updates!

## 2019-10-20 ENCOUNTER — Encounter: Payer: Self-pay | Admitting: Family Medicine

## 2019-10-26 ENCOUNTER — Ambulatory Visit (INDEPENDENT_AMBULATORY_CARE_PROVIDER_SITE_OTHER): Payer: Medicaid Other

## 2019-10-26 DIAGNOSIS — J309 Allergic rhinitis, unspecified: Secondary | ICD-10-CM | POA: Diagnosis not present

## 2019-10-31 ENCOUNTER — Ambulatory Visit (INDEPENDENT_AMBULATORY_CARE_PROVIDER_SITE_OTHER): Payer: Medicaid Other

## 2019-10-31 DIAGNOSIS — J309 Allergic rhinitis, unspecified: Secondary | ICD-10-CM

## 2019-11-07 ENCOUNTER — Ambulatory Visit (INDEPENDENT_AMBULATORY_CARE_PROVIDER_SITE_OTHER): Payer: Medicaid Other

## 2019-11-07 ENCOUNTER — Other Ambulatory Visit: Payer: Self-pay | Admitting: *Deleted

## 2019-11-07 DIAGNOSIS — J309 Allergic rhinitis, unspecified: Secondary | ICD-10-CM | POA: Diagnosis not present

## 2019-11-07 DIAGNOSIS — Z3009 Encounter for other general counseling and advice on contraception: Secondary | ICD-10-CM

## 2019-11-07 MED ORDER — LO LOESTRIN FE 1 MG-10 MCG / 10 MCG PO TABS: 1.0000 | ORAL_TABLET | Freq: Every day | ORAL | 0 refills | Status: DC

## 2019-11-16 ENCOUNTER — Ambulatory Visit (INDEPENDENT_AMBULATORY_CARE_PROVIDER_SITE_OTHER): Payer: Medicaid Other

## 2019-11-16 DIAGNOSIS — J309 Allergic rhinitis, unspecified: Secondary | ICD-10-CM

## 2019-11-21 ENCOUNTER — Ambulatory Visit (INDEPENDENT_AMBULATORY_CARE_PROVIDER_SITE_OTHER): Payer: Medicaid Other

## 2019-11-21 DIAGNOSIS — J309 Allergic rhinitis, unspecified: Secondary | ICD-10-CM

## 2019-11-30 ENCOUNTER — Ambulatory Visit (INDEPENDENT_AMBULATORY_CARE_PROVIDER_SITE_OTHER): Payer: Medicaid Other

## 2019-11-30 DIAGNOSIS — J309 Allergic rhinitis, unspecified: Secondary | ICD-10-CM

## 2019-12-02 ENCOUNTER — Other Ambulatory Visit: Payer: Self-pay | Admitting: Allergy & Immunology

## 2019-12-05 ENCOUNTER — Ambulatory Visit (INDEPENDENT_AMBULATORY_CARE_PROVIDER_SITE_OTHER): Payer: Medicaid Other

## 2019-12-05 DIAGNOSIS — J309 Allergic rhinitis, unspecified: Secondary | ICD-10-CM

## 2019-12-14 ENCOUNTER — Ambulatory Visit (INDEPENDENT_AMBULATORY_CARE_PROVIDER_SITE_OTHER): Payer: Medicaid Other

## 2019-12-14 DIAGNOSIS — J309 Allergic rhinitis, unspecified: Secondary | ICD-10-CM

## 2019-12-21 ENCOUNTER — Ambulatory Visit (INDEPENDENT_AMBULATORY_CARE_PROVIDER_SITE_OTHER): Payer: Medicaid Other

## 2019-12-21 DIAGNOSIS — J309 Allergic rhinitis, unspecified: Secondary | ICD-10-CM | POA: Diagnosis not present

## 2020-02-07 ENCOUNTER — Other Ambulatory Visit: Payer: Self-pay | Admitting: Adult Health

## 2020-02-07 DIAGNOSIS — Z3009 Encounter for other general counseling and advice on contraception: Secondary | ICD-10-CM

## 2020-02-08 ENCOUNTER — Telehealth: Payer: Self-pay | Admitting: Women's Health

## 2020-02-08 NOTE — Telephone Encounter (Signed)
Patient called stating that she called her pharmacy to get a refill of her Birth control and they told her they need a doctors approval for a refill, they had send the request. Pt states that she has never had this happen before why is this happing now. Please contact pt

## 2020-03-04 ENCOUNTER — Ambulatory Visit
Admission: EM | Admit: 2020-03-04 | Discharge: 2020-03-04 | Disposition: A | Discharge status: Home / Self Care | Payer: Medicaid Other | Attending: Emergency Medicine | Admitting: Emergency Medicine

## 2020-03-04 ENCOUNTER — Encounter: Payer: Self-pay | Admitting: Emergency Medicine

## 2020-03-04 ENCOUNTER — Other Ambulatory Visit: Payer: Self-pay

## 2020-03-04 ENCOUNTER — Ambulatory Visit
Admission: EM | Admit: 2020-03-04 | Discharge: 2020-03-04 | Discharge status: Absent without leave | Payer: Medicaid Other

## 2020-03-04 DIAGNOSIS — T7840XA Allergy, unspecified, initial encounter: Secondary | ICD-10-CM

## 2020-03-04 DIAGNOSIS — H60393 Other infective otitis externa, bilateral: Secondary | ICD-10-CM | POA: Diagnosis not present

## 2020-03-04 MED ORDER — MUPIROCIN 2 % EX OINT: 1.0000 "application " | TOPICAL_OINTMENT | Freq: Two times a day (BID) | CUTANEOUS | 0 refills | Status: DC

## 2020-03-04 MED ORDER — PREDNISONE 20 MG PO TABS: 20.0000 mg | ORAL_TABLET | Freq: Two times a day (BID) | ORAL | 0 refills | Status: AC

## 2020-03-04 NOTE — ED Provider Notes (Signed)
[facility]   [id number] 03/04/20 Arrival Time: 1818  CC: Bilateral ear lobe redness and swelling  SUBJECTIVE: History from: patient.  Zoe House is a 19 y.o. female who presents with of bilateral ear lobe swelling and redness x 2 days.  Symptoms began after having ears pierced.  Complains of associated pain.  Complains of itching.  Patient has tried cleaning without relief.  Denies aggravating factors.  reports similar symptoms in the past with nose piercing.   Denies fever, chills, fatigue, sinus pain, rhinorrhea, ear discharge, sore throat, SOB, wheezing, chest pain, nausea.  ROS: As per HPI.  All other pertinent ROS negative.     Past Medical History:  Diagnosis Date  . Asthma   . Eczema    Past Surgical History:  Procedure Laterality Date  . ADENOIDECTOMY    . TONSILLECTOMY    . tubes in ears    . TYMPANOSTOMY TUBE PLACEMENT     Allergies  Allergen Reactions  . Other Other (See Comments)    Pt states that she is allergic to trees, dogs, cats, mold, grass, dust, and 52 other outside sources.  Pt states that she is NOT allergic to any kind of medication or food.     No current facility-administered medications on file prior to encounter.   Current Outpatient Medications on File Prior to Encounter  Medication Sig Dispense Refill  . albuterol (PROVENTIL) (2.5 MG/3ML) 0.083% nebulizer solution Take 3 mLs (2.5 mg total) by nebulization every 4 (four) hours as needed for wheezing or shortness of breath. 75 mL 1  . EPINEPHrine (EPIPEN 2-PAK) 0.3 mg/0.3 mL IJ SOAJ injection Inject 0.3 mLs (0.3 mg total) into the muscle as needed for anaphylaxis. 2 each 1  . fluticasone (FLONASE) 50 MCG/ACT nasal spray Place 2 sprays into both nostrils daily. (Patient taking differently: Place 2 sprays into both nostrils daily. Uses as needed.) 1 g 5  . fluticasone (FLOVENT HFA) 110 MCG/ACT inhaler Inhale 2 puffs into the lungs 2 (two) times daily. (Patient taking differently: Inhale 2  puffs into the lungs daily. ) 1 Inhaler 5  . levocetirizine (XYZAL) 5 MG tablet TAKE 1 TABLET(5 MG) BY MOUTH DAILY 30 tablet 5  . LO LOESTRIN FE 1 MG-10 MCG / 10 MCG tablet TAKE 1 TABLET BY MOUTH DAILY 84 tablet 0  . mometasone (ELOCON) 0.1 % cream APPLY TO THE AFFECTED AREAS TWICE DAILY AS NEEDED FOR ECZEMA. 45 g 5  . montelukast (SINGULAIR) 10 MG tablet Take 1 tablet (10 mg total) by mouth at bedtime. 30 tablet 11   Social History   Socioeconomic History  . Marital status: Single    Spouse name: Not on file  . Number of children: Not on file  . Years of education: Not on file  . Highest education level: Not on file  Occupational History  . Not on file  Tobacco Use  . Smoking status: Never Smoker  . Smokeless tobacco: Never Used  Vaping Use  . Vaping Use: Never used  Substance and Sexual Activity  . Alcohol use: No    Alcohol/week: 0.0 standard drinks  . Drug use: No  . Sexual activity: Yes    Birth control/protection: Pill  Other Topics Concern  . Not on file  Social History Narrative  . Not on file   Social Determinants of Health   Financial Resource Strain:   . Difficulty of Paying Living Expenses: Not on file  Food Insecurity:   . Worried About Radiation protection practitioner  of Food in the Last Year: Not on file  . Ran Out of Food in the Last Year: Not on file  Transportation Needs:   . Lack of Transportation (Medical): Not on file  . Lack of Transportation (Non-Medical): Not on file  Physical Activity:   . Days of Exercise per Week: Not on file  . Minutes of Exercise per Session: Not on file  Stress:   . Feeling of Stress : Not on file  Social Connections:   . Frequency of Communication with Friends and Family: Not on file  . Frequency of Social Gatherings with Friends and Family: Not on file  . Attends Religious Services: Not on file  . Active Member of Clubs or Organizations: Not on file  . Attends Banker Meetings: Not on file  . Marital Status: Not on file    Intimate Partner Violence:   . Fear of Current or Ex-Partner: Not on file  . Emotionally Abused: Not on file  . Physically Abused: Not on file  . Sexually Abused: Not on file   Family History  Problem Relation Age of Onset  . Healthy Mother   . Arthritis Maternal Grandmother     OBJECTIVE:  Vitals:   03/04/20 1829  BP: 125/81  Pulse: 87  Resp: 17  Temp: 99.2 F (37.3 C)  TempSrc: Oral  SpO2: 97%    General appearance: alert; well-appearing, nontoxic; speaking in full sentences and tolerating own secretions HEENT: NCAT; Ears: bilateral ear lobes with swelling, mild erythema and crusting, R>L, mildly TTP, no obvious drainage or bleeding, EACs clear, TMs pearly gray; Eyes: PERRL.  EOM grossly intact.Nose: nares patent without rhinorrhea, Throat: oropharynx clear, tonsils non erythematous or enlarged, uvula midline  Neck: supple without LAD Lungs: unlabored respirations, symmetrical air entry; cough: absent; no respiratory distress; CTAB Heart: regular rate and rhythm.  Skin: warm and dry Psychological: alert and cooperative; normal mood and affect   ASSESSMENT & PLAN:  1. Infection of both earlobes   2. Allergic reaction, initial encounter     Meds ordered this encounter  Medications  . predniSONE (DELTASONE) 20 MG tablet    Sig: Take 1 tablet (20 mg total) by mouth 2 (two) times daily with a meal for 5 days.    Dispense:  10 tablet    Refill:  0    Order Specific Question:   Supervising Provider    Answer:   Eustace Moore [[id number]]  . mupirocin ointment (BACTROBAN) 2 %    Sig: Apply 1 application topically 2 (two) times daily.    Dispense:  22 g    Refill:  0    Order Specific Question:   Supervising Provider    Answer:   Eustace Moore [[id number]]    Rest and drink plenty of fluids Remove current earrings Clean with warm water and mild soap  Prednisone prescribed.  Take as directed and to completion Bactroban ointment prescribed.  Use as  directed Continue to use OTC ibuprofen and/ or tylenol as needed for pain control Follow up with PCP if symptoms persists Return here or go to the ER if you have any new or worsening symptoms fever, chills, nausea, vomiting, redness, swelling, discharge, etc...  Reviewed expectations re: course of current medical issues. Questions answered. Outlined signs and symptoms indicating need for more acute intervention. Patient verbalized understanding. After Visit Summary given.         Rennis Harding, PA-C 03/04/20 1840

## 2020-03-04 NOTE — Discharge Instructions (Signed)
Rest and drink plenty of fluids Remove current earrings Clean with warm water and mild soap  Prednisone prescribed.  Take as directed and to completion Bactroban ointment prescribed.  Use as directed Continue to use OTC ibuprofen and/ or tylenol as needed for pain control Follow up with PCP if symptoms persists Return here or go to the ER if you have any new or worsening symptoms fever, chills, nausea, vomiting, redness, swelling, discharge, etc..Marland Kitchen

## 2020-03-04 NOTE — ED Triage Notes (Signed)
Pt triaged and DC  by provider prior to RN 

## 2020-03-24 DIAGNOSIS — J3089 Other allergic rhinitis: Secondary | ICD-10-CM | POA: Diagnosis not present

## 2020-03-24 NOTE — Progress Notes (Signed)
VIALS EXP 03-24-21 

## 2020-03-25 DIAGNOSIS — J3081 Allergic rhinitis due to animal (cat) (dog) hair and dander: Secondary | ICD-10-CM | POA: Diagnosis not present

## 2020-04-09 ENCOUNTER — Other Ambulatory Visit: Payer: Self-pay

## 2020-04-09 ENCOUNTER — Encounter: Payer: Self-pay | Admitting: Allergy & Immunology

## 2020-04-09 ENCOUNTER — Ambulatory Visit (INDEPENDENT_AMBULATORY_CARE_PROVIDER_SITE_OTHER): Payer: Medicaid Other | Admitting: Allergy & Immunology

## 2020-04-09 VITALS — BP 102/60 | HR 72 | Temp 98.4°F | Resp 16 | Ht 62.0 in | Wt 168.6 lb

## 2020-04-09 DIAGNOSIS — J4540 Moderate persistent asthma, uncomplicated: Secondary | ICD-10-CM

## 2020-04-09 DIAGNOSIS — J302 Other seasonal allergic rhinitis: Secondary | ICD-10-CM

## 2020-04-09 DIAGNOSIS — J3089 Other allergic rhinitis: Secondary | ICD-10-CM

## 2020-04-09 NOTE — Progress Notes (Signed)
FOLLOW UP  Date of Service/Encounter:  04/09/20   Assessment:   Moderate persistent asthma, uncomplicated  Seasonal and perennial allergic rhinitis(grasses, weeds, trees, molds, dust mites, cat, dog)  Plan/Recommendations:   1. Moderate persistent asthma, uncomplicated - Lung testing looks great today. - Daily controller medication(s): Flovent 1 puffs twice daily with spacer - Prior to physical activity: ProAir 2 puffs 10-15 minutes before physical activity. - Rescue medications: ProAir 4 puffs every 4-6 hours as needed - Changes during respiratory infections or worsening symptoms: Increase Flovent to 4 puffs twice daily for TWO WEEKS. - Asthma control goals:  * Full participation in all desired activities (may need albuterol before activity) * Albuterol use two time or less a week on average (not counting use with activity) * Cough interfering with sleep two time or less a month * Oral steroids no more than once a year * No hospitalizations  2. Seasonal and perennial allergic rhinitis (grasses, weeds, trees, molds, dust mites, cat, dog) - Continue with montelukast.   - Continue with Xyzal 5mg  daily (you can take an extra one on particularly bad days). - Continue with fluticasone nasal spray one spray per nostril daily. - You can restart allergy shots when things become less crazy at home.   3. Eczema - Continue with moisturizer twice daily.  - Continue with Elocon as needed twice daily.   4. Return in about 6 months (around 10/08/2020).   Subjective:   Zoe House is a 19 y.o. female presenting today for follow up of  Chief Complaint  Patient presents with  . Follow-up  . Asthma    Zoe House has a history of the following: Patient Active Problem List   Diagnosis Date Noted  . Allergic rhinitis 10/19/2019  . Moderate persistent asthma, uncomplicated 05/17/2017  . Seasonal and perennial allergic rhinitis 05/17/2017  . Asthma 11/12/2015     History obtained from: chart review and patient.  Zoe House is a 19 y.o. female presenting for a follow up visit.  She was last seen in April 2021.  At that time, she was doing very well.  Her lung testing looked good.  There were some slight wheezes.  The nurse practitioner recommended increasing her Flovent to 2 puffs twice daily for the next couple weeks.  For her rhinitis, she is continued on allergy injections as well as Flonase, Xyzal, and montelukast.  Her Elocon was refilled for her eczema.  Since last visit, she has mostly done well. Unfortunately, she has had some travel issues and has had to stop her allergy shots.   Asthma/Respiratory Symptom History: She is on the Flovent one puff twice daily. She has not needed the rescue inhaler at all. She has not needed steroids for her breathing at all. Zoe House's asthma has been well controlled. She has not required rescue medication, experienced nocturnal awakenings due to lower respiratory symptoms, nor have activities of daily living been limited. She has required no Emergency Department or Urgent Care visits for her asthma. She has required zero courses of systemic steroids for asthma exacerbations since the last visit. ACT score today is 25, indicating excellent asthma symptom control.   Allergic Rhinitis Symptom History: Allergies have been fairly well controlled but the weather changes have led to some sneezing. she is on the Xyzal and the Singualir daily. She    Zoe House is on allergen immunotherapy. She receives two injections. Immunotherapy script #1 contains trees, weeds, grasses, cat and dog. She currently receives 0.44mL  of the GREEN vial (1/1,000). Immunotherapy script #2 contains ragweed, molds and dust mites. She currently receives 0.67mL of the GREEN vial (1/1,000). She started shots October of 2020 and not yet reached maintenance. Her last shot was in June 2021. She started having transportation issues.   Otherwise, there have been no  changes to her past medical history, surgical history, family history, or social history.    Review of Systems  Constitutional: Negative.  Negative for fever, malaise/fatigue and weight loss.  HENT: Negative.  Negative for congestion, ear discharge and ear pain.   Eyes: Negative for pain, discharge and redness.  Respiratory: Negative for cough, sputum production, shortness of breath and wheezing.   Cardiovascular: Negative.  Negative for chest pain and palpitations.  Gastrointestinal: Negative for abdominal pain, heartburn, nausea and vomiting.  Skin: Negative.  Negative for itching and rash.  Neurological: Negative for dizziness and headaches.  Endo/Heme/Allergies: Negative for environmental allergies. Does not bruise/bleed easily.       Objective:   Blood pressure 102/60, pulse 72, temperature 98.4 F (36.9 C), temperature source Temporal, resp. rate 16, height 5\' 2"  (1.575 m), weight 168 lb 9.6 oz (76.5 kg), SpO2 99 %. Body mass index is 30.84 kg/m.   Physical Exam:  Physical Exam Constitutional:      Appearance: She is well-developed.     Comments: Very pleasant female.  HENT:     Head: Normocephalic and atraumatic.     Right Ear: Tympanic membrane, ear canal and external ear normal.     Left Ear: Tympanic membrane, ear canal and external ear normal.     Nose: No nasal deformity, septal deviation, mucosal edema or rhinorrhea.     Right Turbinates: Enlarged and swollen.     Left Turbinates: Enlarged and swollen.     Right Sinus: No maxillary sinus tenderness or frontal sinus tenderness.     Left Sinus: No maxillary sinus tenderness or frontal sinus tenderness.     Mouth/Throat:     Mouth: Mucous membranes are not pale and not dry.     Pharynx: Uvula midline.  Eyes:     General:        Right eye: No discharge.        Left eye: No discharge.     Conjunctiva/sclera: Conjunctivae normal.     Right eye: Right conjunctiva is not injected. No chemosis.    Left eye: Left  conjunctiva is not injected. No chemosis.    Pupils: Pupils are equal, round, and reactive to light.  Cardiovascular:     Rate and Rhythm: Normal rate and regular rhythm.     Heart sounds: Normal heart sounds.  Pulmonary:     Effort: Pulmonary effort is normal. No tachypnea, accessory muscle usage or respiratory distress.     Breath sounds: Normal breath sounds. No wheezing, rhonchi or rales.     Comments: Moving air well in all lung fields. No increased work of breathing noted. Chest:     Chest wall: No tenderness.  Lymphadenopathy:     Cervical: No cervical adenopathy.  Skin:    Coloration: Skin is not pale.     Findings: No abrasion, erythema, petechiae or rash. Rash is not papular, urticarial or vesicular.  Neurological:     Mental Status: She is alert.  Psychiatric:        Behavior: Behavior is cooperative.      Diagnostic studies:    Spirometry: results normal (FEV1: 2.70/87%, FVC: 3.31/94%, FEV1/FVC: 82%).    Spirometry  consistent with normal pattern.   Allergy Studies: none       Zoe Marvel, MD  [facility]

## 2020-04-09 NOTE — Patient Instructions (Addendum)
1. Moderate persistent asthma, uncomplicated - Lung testing looks great today. - Daily controller medication(s): Flovent 1 puffs twice daily with spacer - Prior to physical activity: ProAir 2 puffs 10-15 minutes before physical activity. - Rescue medications: ProAir 4 puffs every 4-6 hours as needed - Changes during respiratory infections or worsening symptoms: Increase Flovent to 4 puffs twice daily for TWO WEEKS. - Asthma control goals:  * Full participation in all desired activities (may need albuterol before activity) * Albuterol use two time or less a week on average (not counting use with activity) * Cough interfering with sleep two time or less a month * Oral steroids no more than once a year * No hospitalizations  2. Seasonal and perennial allergic rhinitis (grasses, weeds, trees, molds, dust mites, cat, dog) - Continue with montelukast.   - Continue with Xyzal 5mg  daily (you can take an extra one on particularly bad days). - Continue with fluticasone nasal spray one spray per nostril daily. - You can restart allergy shots when things become less crazy at home.   3. Eczema - Continue with moisturizer twice daily.  - Continue with Elocon as needed twice daily.   4. Return in about 6 months (around 10/08/2020).    Please inform 12/08/2020 of any Emergency Department visits, hospitalizations, or changes in symptoms. Call us before going to the ED for breathing or allergy symptoms since we might be able to fit you in for a sick visit. Feel free to contact us anytime with any questions, problems, or concerns.  It was a pleasure to see you again today!  Websites that have reliable patient information: 1. American Academy of Asthma, Allergy, and Immunology: www.aaaai.org 2. Food Allergy Research and Education (FARE): foodallergy.org 3. Mothers of Asthmatics: http://www.asthmacommunitynetwork.org 4. American College of Allergy, Asthma, and Immunology: www.acaai.org   COVID-19  Vaccine Information can be found at: [place] For questions related to vaccine distribution or appointments, please email vaccine@[email or web address] or call [phone].     "Like" [phone] on Facebook and Instagram for our latest updates!     HAPPY FALL!     Make sure you are registered to vote! If you have moved or changed any of your contact information, you will need to get this updated before voting!  In some cases, you MAY be able to register to vote online: [place]

## 2020-04-10 ENCOUNTER — Encounter: Payer: Self-pay | Admitting: Allergy & Immunology

## 2020-04-13 DIAGNOSIS — S060X0A Concussion without loss of consciousness, initial encounter: Secondary | ICD-10-CM | POA: Diagnosis not present

## 2020-04-21 ENCOUNTER — Ambulatory Visit: Payer: Medicaid Other | Admitting: Family Medicine

## 2020-04-23 ENCOUNTER — Other Ambulatory Visit: Payer: Self-pay | Admitting: Allergy & Immunology

## 2020-04-23 MED ORDER — LEVOCETIRIZINE DIHYDROCHLORIDE 5 MG PO TABS: ORAL_TABLET | ORAL | 5 refills | Status: DC

## 2020-04-23 MED ORDER — FLOVENT HFA 110 MCG/ACT IN AERO: 1.0000 | INHALATION_SPRAY | Freq: Two times a day (BID) | RESPIRATORY_TRACT | 5 refills | Status: DC

## 2020-04-23 NOTE — Telephone Encounter (Signed)
Refills have been sent to the requested pharmacy and patient has been made aware. 

## 2020-04-23 NOTE — Telephone Encounter (Signed)
Patient is requesting refills for Flovent and Xyzal. She said she called the pharmacy and the prescriptions were expired. She was last seen 04-09-20. [facility] on [address]

## 2020-04-30 ENCOUNTER — Other Ambulatory Visit (HOSPITAL_COMMUNITY)
Admission: RE | Admit: 2020-04-30 | Discharge: 2020-04-30 | Disposition: A | Discharge status: Home / Self Care | Payer: Medicaid Other | Source: Ambulatory Visit | Attending: Advanced Practice Midwife | Admitting: Advanced Practice Midwife

## 2020-04-30 ENCOUNTER — Encounter: Payer: Self-pay | Admitting: Advanced Practice Midwife

## 2020-04-30 ENCOUNTER — Other Ambulatory Visit: Payer: Self-pay

## 2020-04-30 ENCOUNTER — Ambulatory Visit (INDEPENDENT_AMBULATORY_CARE_PROVIDER_SITE_OTHER): Payer: Medicaid Other | Admitting: Advanced Practice Midwife

## 2020-04-30 VITALS — BP 129/86 | HR 93 | Ht 62.0 in | Wt 168.2 lb

## 2020-04-30 DIAGNOSIS — Z3009 Encounter for other general counseling and advice on contraception: Secondary | ICD-10-CM

## 2020-04-30 DIAGNOSIS — Z01419 Encounter for gynecological examination (general) (routine) without abnormal findings: Secondary | ICD-10-CM | POA: Insufficient documentation

## 2020-04-30 MED ORDER — LO LOESTRIN FE 1 MG-10 MCG / 10 MCG PO TABS: 1.0000 | ORAL_TABLET | Freq: Every day | ORAL | 4 refills | Status: DC

## 2020-04-30 NOTE — Progress Notes (Signed)
WELL-WOMAN EXAMINATION Patient name: Zoe House MRN [id number]  Date of birth: 2000-11-12 Chief Complaint:   Gynecologic Exam (needs refill on birth control)  History of Present Illness:   Zoe House is a 19 y.o. [id number] Caucasian female being seen today for a routine well-woman exam.  Current complaints: doing well; has 4yo daughter that she stays home with; first Pap smear today; reports happy on Lo Loestrin with 3-4d cycles and would like to continue  Depression screen [facility] 2/9 04/30/2020 03/30/2018  Decreased Interest 0 0  Down, Depressed, Hopeless 0 0  PHQ - 2 Score 0 0  Altered sleeping 0 -  Tired, decreased energy 2 -  Change in appetite 0 -  Feeling bad or failure about yourself  0 -  Trouble concentrating 0 -  Moving slowly or fidgety/restless 0 -  Suicidal thoughts 0 -  PHQ-9 Score 2 -     PCP: Dr Phillips Odor      does not desire labs No LMP recorded. (Menstrual status: Oral contraceptives). The current method of family planning is OCP (estrogen/progesterone).  Last pap never Last mammogram: <40yo. Family h/o breast cancer: no Last colonoscopy: <45yo. Family h/o colorectal cancer: no Review of Systems:   Pertinent items are noted in HPI Denies any headaches, blurred vision, fatigue, shortness of breath, chest pain, abdominal pain, abnormal vaginal discharge/itching/odor/irritation, problems with periods, bowel movements, urination, or intercourse unless otherwise stated above. Pertinent History Reviewed:  Reviewed past medical,surgical, social and family history.  Reviewed problem list, medications and allergies. Physical Assessment:   Vitals:   04/30/20 1526  BP: 129/86  Pulse: 93  Weight: 168 lb 3.2 oz (76.3 kg)  Height: 5\' 2"  (1.575 m)  Body mass index is 30.76 kg/m.        Physical Examination:   General appearance - well appearing, and in no distress  Mental status - alert, oriented to person, place, and time  Psych:  She has a normal mood and  affect  Skin - warm and dry, normal color, no suspicious lesions noted  Chest - effort normal, all lung fields clear to auscultation bilaterally  Heart - normal rate and regular rhythm  Neck:  midline trachea, no thyromegaly or nodules  Breasts - breasts appear normal, no suspicious masses, no skin or nipple changes or  axillary nodes  Abdomen - soft, nontender, nondistended, no masses or organomegaly  Pelvic - VULVA: normal appearing vulva with no masses, tenderness or lesions  VAGINA: normal appearing vagina with normal color and discharge, no lesions  CERVIX: normal appearing cervix without discharge or lesions, no CMT  Thin prep pap is done without HR HPV cotesting  UTERUS: uterus is felt to be normal size, shape, consistency and nontender   ADNEXA: No adnexal masses or tenderness noted.  Extremities:  No swelling or varicosities noted  Chaperone:    No results found for this or any previous visit (from the past 24 hour(s)).  Assessment & Plan:  1) Well-Woman Exam  2) Desires to continue on Lo Loestrin, refilled x 80yr  Labs/procedures today: Pap  Mammogram age 66 or sooner if problems Colonoscopy age 57 or sooner if problems  No orders of the defined types were placed in this encounter.   Meds:  Meds ordered this encounter  Medications  . Norethindrone-Ethinyl Estradiol-Fe Biphas (LO LOESTRIN FE) 1 MG-10 MCG / 10 MCG tablet    Sig: Take 1 tablet by mouth daily.    Dispense:  84 tablet  Refill:  4    Tel pt to make appointment before more refills    Order Specific Question:   Supervising Provider    Answer:   Lazaro Arms [[id number]]    Follow-up: Return in about 1 year (around 04/30/2021) for Physical.  Arabella Merles CNM 04/30/2020 4:06 PM

## 2020-05-02 LAB — CYTOLOGY - PAP
Chlamydia: NEGATIVE
Comment: NEGATIVE
Comment: NORMAL
Diagnosis: NEGATIVE
Neisseria Gonorrhea: NEGATIVE

## 2020-05-13 ENCOUNTER — Encounter: Payer: Self-pay | Admitting: Emergency Medicine

## 2020-05-13 ENCOUNTER — Other Ambulatory Visit: Payer: Self-pay

## 2020-05-13 ENCOUNTER — Ambulatory Visit
Admission: EM | Admit: 2020-05-13 | Discharge: 2020-05-13 | Disposition: A | Discharge status: Home / Self Care | Payer: Medicaid Other | Attending: Emergency Medicine | Admitting: Emergency Medicine

## 2020-05-13 DIAGNOSIS — R0989 Other specified symptoms and signs involving the circulatory and respiratory systems: Secondary | ICD-10-CM

## 2020-05-13 DIAGNOSIS — R52 Pain, unspecified: Secondary | ICD-10-CM | POA: Diagnosis not present

## 2020-05-13 DIAGNOSIS — R0981 Nasal congestion: Secondary | ICD-10-CM | POA: Diagnosis not present

## 2020-05-13 MED ORDER — CETIRIZINE-PSEUDOEPHEDRINE ER 5-120 MG PO TB12: 1.0000 | ORAL_TABLET | Freq: Every day | ORAL | 0 refills | Status: DC

## 2020-05-13 NOTE — ED Triage Notes (Signed)
Provider triage  

## 2020-05-13 NOTE — Discharge Instructions (Signed)
COVID testing ordered.  It will take between 5-7 days for test results.  Someone will contact you regarding abnormal results.    In the meantime: You should remain isolated in your home for 10 days from symptom onset AND greater than 72 hours after symptoms resolution (absence of fever without the use of fever-reducing medication and improvement in respiratory symptoms), whichever is longer Get plenty of rest and push fluids Zyrtec d prescribed.  Use as directed for congestion and runny nose Use medications daily for symptom relief Use OTC medications like ibuprofen or tylenol as needed fever or pain Call or go to the ED if you have any new or worsening symptoms such as fever, cough, shortness of breath, chest tightness, chest pain, turning blue, changes in mental status, etc..Marland Kitchen

## 2020-05-13 NOTE — ED Provider Notes (Addendum)
[facility]   [id number] 05/13/20 Arrival Time: 1804   CC: COVID symptoms  SUBJECTIVE: History from: patient.  Zoe House is a 19 y.o. female who presents with runny nose, congestion, and body aches x 2 days.  Denies sick exposure to COVID, flu or strep.  Denies alleviating or aggravating factors.  Denies previous covid infection in the past.   Denies fever, chills, SOB, wheezing, chest pain, nausea, changes in bowel or bladder habits.    ROS: As per HPI.  All other pertinent ROS negative.     Past Medical History:  Diagnosis Date  . Asthma   . Eczema    Past Surgical History:  Procedure Laterality Date  . ADENOIDECTOMY    . TONSILLECTOMY    . tubes in ears    . TYMPANOSTOMY TUBE PLACEMENT     Allergies  Allergen Reactions  . Other Other (See Comments)    Pt states that she is allergic to trees, dogs, cats, mold, grass, dust, and 52 other outside sources.  Pt states that she is NOT allergic to any kind of medication or food.     No current facility-administered medications on file prior to encounter.   Current Outpatient Medications on File Prior to Encounter  Medication Sig Dispense Refill  . albuterol (PROVENTIL) (2.5 MG/3ML) 0.083% nebulizer solution Take 3 mLs (2.5 mg total) by nebulization every 4 (four) hours as needed for wheezing or shortness of breath. 75 mL 1  . EPINEPHrine (EPIPEN 2-PAK) 0.3 mg/0.3 mL IJ SOAJ injection Inject 0.3 mLs (0.3 mg total) into the muscle as needed for anaphylaxis. 2 each 1  . fluticasone (FLONASE) 50 MCG/ACT nasal spray Place 2 sprays into both nostrils daily. (Patient taking differently: Place 2 sprays into both nostrils daily. Uses as needed.) 1 g 5  . fluticasone (FLOVENT HFA) 110 MCG/ACT inhaler Inhale 1 puff into the lungs 2 (two) times daily. 12 g 5  . levocetirizine (XYZAL) 5 MG tablet TAKE 1 TABLET(5 MG) BY MOUTH DAILY 30 tablet 5  . mometasone (ELOCON) 0.1 % cream APPLY TO THE AFFECTED AREAS TWICE DAILY AS NEEDED  FOR ECZEMA. 45 g 5  . montelukast (SINGULAIR) 10 MG tablet Take 1 tablet (10 mg total) by mouth at bedtime. 30 tablet 11  . mupirocin ointment (BACTROBAN) 2 % Apply 1 application topically 2 (two) times daily. (Patient not taking: Reported on 04/09/2020) 22 g 0  . Norethindrone-Ethinyl Estradiol-Fe Biphas (LO LOESTRIN FE) 1 MG-10 MCG / 10 MCG tablet Take 1 tablet by mouth daily. 84 tablet 4   Social History   Socioeconomic History  . Marital status: Single    Spouse name: Not on file  . Number of children: Not on file  . Years of education: Not on file  . Highest education level: Not on file  Occupational History  . Not on file  Tobacco Use  . Smoking status: Never Smoker  . Smokeless tobacco: Never Used  Vaping Use  . Vaping Use: Never used  Substance and Sexual Activity  . Alcohol use: No    Alcohol/week: 0.0 standard drinks  . Drug use: No  . Sexual activity: Yes    Birth control/protection: Pill  Other Topics Concern  . Not on file  Social History Narrative  . Not on file   Social Determinants of Health   Financial Resource Strain: Low Risk   . Difficulty of Paying Living Expenses: Not very hard  Food Insecurity: No Food Insecurity  . Worried About  Running Out of Food in the Last Year: Never true  . Ran Out of Food in the Last Year: Never true  Transportation Needs: No Transportation Needs  . Lack of Transportation (Medical): No  . Lack of Transportation (Non-Medical): No  Physical Activity: Insufficiently Active  . Days of Exercise per Week: 6 days  . Minutes of Exercise per Session: 20 min  Stress: Stress Concern Present  . Feeling of Stress : To some extent  Social Connections: Moderately Isolated  . Frequency of Communication with Friends and Family: Three times a week  . Frequency of Social Gatherings with Friends and Family: Twice a week  . Attends Religious Services: Never  . Active Member of Clubs or Organizations: No  . Attends Banker  Meetings: Never  . Marital Status: Living with partner  Intimate Partner Violence: Not At Risk  . Fear of Current or Ex-Partner: No  . Emotionally Abused: No  . Physically Abused: No  . Sexually Abused: No   Family History  Problem Relation Age of Onset  . Healthy Mother   . Arthritis Maternal Grandmother     OBJECTIVE:  Vitals:   05/13/20 1832  BP: 117/79  Pulse: (!) 109  Resp: 20  Temp: 98.4 F (36.9 C)  SpO2: 98%    General appearance: alert; mildly fatigued appearing, nontoxic; speaking in full sentences and tolerating own secretions HEENT: NCAT; Ears: EACs clear, TMs pearly gray; Eyes: PERRL.  EOM grossly intact.Nose: nares patent without rhinorrhea, Throat: oropharynx clear, tonsils non erythematous or enlarged, uvula midline  Neck: supple without LAD Lungs: unlabored respirations, symmetrical air entry; cough: absent; no respiratory distress; CTAB Heart: regular rate and rhythm.  Skin: warm and dry Psychological: alert and cooperative; normal mood and affect   ASSESSMENT & PLAN:  1. Sinus congestion   2. Runny nose   3. Body aches     Meds ordered this encounter  Medications  . cetirizine-pseudoephedrine (ZYRTEC-D) 5-120 MG tablet    Sig: Take 1 tablet by mouth daily.    Dispense:  30 tablet    Refill:  0    Order Specific Question:   Supervising Provider    Answer:   Eustace Moore [[id number]]    COVID testing ordered.  It will take between 5-7 days for test results.  Someone will contact you regarding abnormal results.    In the meantime: You should remain isolated in your home for 10 days from symptom onset AND greater than 72 hours after symptoms resolution (absence of fever without the use of fever-reducing medication and improvement in respiratory symptoms), whichever is longer Get plenty of rest and push fluids Zyrtec d prescribed.  Use as directed for congestion and runny nose Use medications daily for symptom relief Use OTC medications like  ibuprofen or tylenol as needed fever or pain Call or go to the ED if you have any new or worsening symptoms such as fever, cough, shortness of breath, chest tightness, chest pain, turning blue, changes in mental status, etc...   Reviewed expectations re: course of current medical issues. Questions answered. Outlined signs and symptoms indicating need for more acute intervention. Patient verbalized understanding. After Visit Summary given.         Rennis Harding, PA-C 05/13/20 1836    Rennis Harding, PA-C 05/13/20 1838

## 2020-05-14 ENCOUNTER — Ambulatory Visit: Payer: Medicaid Other | Admitting: Advanced Practice Midwife

## 2020-05-15 LAB — NOVEL CORONAVIRUS, NAA: SARS-CoV-2, NAA: NOT DETECTED

## 2020-05-15 LAB — SARS-COV-2, NAA 2 DAY TAT

## 2020-05-26 ENCOUNTER — Encounter: Payer: Self-pay | Admitting: Women's Health

## 2020-05-26 ENCOUNTER — Ambulatory Visit (INDEPENDENT_AMBULATORY_CARE_PROVIDER_SITE_OTHER): Payer: Medicaid Other | Admitting: Women's Health

## 2020-05-26 ENCOUNTER — Other Ambulatory Visit: Payer: Self-pay

## 2020-05-26 VITALS — BP 137/88 | HR 94 | Ht 62.0 in | Wt 168.0 lb

## 2020-05-26 DIAGNOSIS — R5383 Other fatigue: Secondary | ICD-10-CM | POA: Diagnosis not present

## 2020-05-26 DIAGNOSIS — R6882 Decreased libido: Secondary | ICD-10-CM | POA: Diagnosis not present

## 2020-05-26 NOTE — Progress Notes (Signed)
   GYN VISIT Patient name: Zoe House MRN [id number]  Date of birth: 02-Jul-2001 Chief Complaint:   Contraception (discuss a different option, currently on pill)  History of Present Illness:   Zoe House is a 19 y.o. G31P1001 Caucasian female being seen today for reports of fatigue, no sex drive, no desire to do anything. Not sure if it is her birth control, or something else going on. Has been on LoLoestrin for about 3 years. Likes taking pills. Denies dep/anx, SI/HI. Eats and sleeps well.  Depression screen [facility] 2/9 04/30/2020 03/30/2018  Decreased Interest 0 0  Down, Depressed, Hopeless 0 0  PHQ - 2 Score 0 0  Altered sleeping 0 -  Tired, decreased energy 2 -  Change in appetite 0 -  Feeling bad or failure about yourself  0 -  Trouble concentrating 0 -  Moving slowly or fidgety/restless 0 -  Suicidal thoughts 0 -  PHQ-9 Score 2 -    No LMP recorded. (Menstrual status: Oral contraceptives). The current method of family planning is OCP (estrogen/progesterone).  Last pap 04/30/20. Results were:  normal Review of Systems:   Pertinent items are noted in HPI Denies fever/chills, dizziness, headaches, visual disturbances, fatigue, shortness of breath, chest pain, abdominal pain, vomiting, abnormal vaginal discharge/itching/odor/irritation, problems with periods, bowel movements, urination, or intercourse unless otherwise stated above.  Pertinent History Reviewed:  Reviewed past medical,surgical, social, obstetrical and family history.  Reviewed problem list, medications and allergies. Physical Assessment:   Vitals:   05/26/20 1358  BP: 137/88  Pulse: 94  Weight: 168 lb (76.2 kg)  Height: 5\' 2"  (1.575 m)  Body mass index is 30.73 kg/m.       Physical Examination:   General appearance: alert, well appearing, and in no distress  Mental status: alert, oriented to person, place, and time  Skin: warm & dry   Cardiovascular: normal heart rate noted  Respiratory: normal  respiratory effort, no distress  Abdomen: soft, non-tender   Pelvic: examination not indicated  Extremities: no edema   Chaperone: N/A    No results found for this or any previous visit (from the past 24 hour(s)).  Assessment & Plan:  1) Fatigue, decreased libido, decreased interest> will check labs, PHQ9 is 2 so doesn't appear to be depression related. If labs neg, will discuss options  Meds: No orders of the defined types were placed in this encounter.   Orders Placed This Encounter  Procedures  . CBC  . TSH  . VITAMIN D 25 Hydroxy (Vit-D Deficiency, Fractures)    Return for prn.  CNM, [facility] 05/26/2020 2:30 PM

## 2020-05-27 LAB — CBC
Hematocrit: 45.7 % (ref 34.0–46.6)
Hemoglobin: 15.1 g/dL (ref 11.1–15.9)
MCH: 29.1 pg (ref 26.6–33.0)
MCHC: 33 g/dL (ref 31.5–35.7)
MCV: 88 fL (ref 79–97)
Platelets: 401 10*3/uL (ref 150–450)
RBC: 5.19 x10E6/uL (ref 3.77–5.28)
RDW: 12.4 % (ref 11.7–15.4)
WBC: 10.8 10*3/uL (ref 3.4–10.8)

## 2020-05-27 LAB — VITAMIN D 25 HYDROXY (VIT D DEFICIENCY, FRACTURES): Vit D, 25-Hydroxy: 27.2 ng/mL — ABNORMAL LOW (ref 30.0–100.0)

## 2020-05-27 LAB — TSH: TSH: 3.25 u[IU]/mL (ref 0.450–4.500)

## 2020-06-09 ENCOUNTER — Other Ambulatory Visit: Payer: Self-pay | Admitting: Family Medicine

## 2020-07-02 ENCOUNTER — Other Ambulatory Visit: Payer: Self-pay

## 2020-07-02 ENCOUNTER — Encounter: Payer: Self-pay | Admitting: Emergency Medicine

## 2020-07-02 ENCOUNTER — Ambulatory Visit
Admission: EM | Admit: 2020-07-02 | Discharge: 2020-07-02 | Disposition: A | Discharge status: Home / Self Care | Payer: BLUE CROSS/BLUE SHIELD | Attending: Family Medicine | Admitting: Family Medicine

## 2020-07-02 DIAGNOSIS — J3489 Other specified disorders of nose and nasal sinuses: Secondary | ICD-10-CM

## 2020-07-02 DIAGNOSIS — B349 Viral infection, unspecified: Secondary | ICD-10-CM

## 2020-07-02 DIAGNOSIS — R059 Cough, unspecified: Secondary | ICD-10-CM | POA: Diagnosis not present

## 2020-07-02 DIAGNOSIS — J029 Acute pharyngitis, unspecified: Secondary | ICD-10-CM | POA: Diagnosis not present

## 2020-07-02 DIAGNOSIS — H9202 Otalgia, left ear: Secondary | ICD-10-CM

## 2020-07-02 DIAGNOSIS — R0981 Nasal congestion: Secondary | ICD-10-CM | POA: Diagnosis not present

## 2020-07-02 DIAGNOSIS — J069 Acute upper respiratory infection, unspecified: Secondary | ICD-10-CM

## 2020-07-02 MED ORDER — AZITHROMYCIN 250 MG PO TABS: 250.0000 mg | ORAL_TABLET | Freq: Every day | ORAL | 0 refills | Status: DC

## 2020-07-02 NOTE — Discharge Instructions (Signed)
I have sent in azithromycin for you to take. Take 2 tablets today, then one tablet daily for the next 4 days.  Fill this if viral tests are negative on Friday and if symptoms are persisting  Your COVID and Flu tests are pending.  You should self quarantine until the test results are back.    Take Tylenol or ibuprofen as needed for fever or discomfort.  Rest and keep yourself hydrated.    Follow-up with your primary care provider if your symptoms are not improving.

## 2020-07-02 NOTE — ED Triage Notes (Signed)
sinus pressure, left ear pain, congestion since christmas night.

## 2020-07-02 NOTE — ED Provider Notes (Signed)
[facility]   [id number] 07/02/20 Arrival Time: 1735   CC: COVID symptoms  SUBJECTIVE: History from: patient.  Zoe House is a 19 y.o. female who presents with abrupt onset of nasal congestion, sinus pressure, left ear pain for the last 4 days. Denies sick exposure to COVID, flu or strep. Denies recent travel. Has negative history of Covid. Has not completed Covid vaccines. Has taken OTC cough and cold with little relief. There are no aggravating or alleviating factors. Denies previous symptoms in the past. Denies fever, chills, fatigue, rhinorrhea, sore throat, SOB, wheezing, chest pain, nausea, changes in bowel or bladder habits.    ROS: As per HPI.  All other pertinent ROS negative.     Past Medical History:  Diagnosis Date  . Asthma   . Eczema    Past Surgical History:  Procedure Laterality Date  . ADENOIDECTOMY    . TONSILLECTOMY    . tubes in ears    . TYMPANOSTOMY TUBE PLACEMENT     Allergies  Allergen Reactions  . Other Other (See Comments)    Pt states that she is allergic to trees, dogs, cats, mold, grass, dust, and 52 other outside sources.  Pt states that she is NOT allergic to any kind of medication or food.     No current facility-administered medications on file prior to encounter.   Current Outpatient Medications on File Prior to Encounter  Medication Sig Dispense Refill  . albuterol (PROVENTIL) (2.5 MG/3ML) 0.083% nebulizer solution Take 3 mLs (2.5 mg total) by nebulization every 4 (four) hours as needed for wheezing or shortness of breath. 75 mL 1  . cetirizine-pseudoephedrine (ZYRTEC-D) 5-120 MG tablet Take 1 tablet by mouth daily. 30 tablet 0  . EPINEPHrine (EPIPEN 2-PAK) 0.3 mg/0.3 mL IJ SOAJ injection Inject 0.3 mLs (0.3 mg total) into the muscle as needed for anaphylaxis. 2 each 1  . fluticasone (FLONASE) 50 MCG/ACT nasal spray Place 2 sprays into both nostrils daily. (Patient taking differently: Place 2 sprays into both nostrils daily.  Uses as needed.) 1 g 5  . fluticasone (FLOVENT HFA) 110 MCG/ACT inhaler Inhale 1 puff into the lungs 2 (two) times daily. 12 g 5  . levocetirizine (XYZAL) 5 MG tablet TAKE 1 TABLET(5 MG) BY MOUTH DAILY 30 tablet 5  . mometasone (ELOCON) 0.1 % cream APPLY EXTERNALLY TO THE AFFECTED AREA TWICE DAILY AS NEEDED FOR ECZEMA 45 g 5  . montelukast (SINGULAIR) 10 MG tablet Take 1 tablet (10 mg total) by mouth at bedtime. 30 tablet 11  . mupirocin ointment (BACTROBAN) 2 % Apply 1 application topically 2 (two) times daily. (Patient not taking: Reported on 04/09/2020) 22 g 0  . Norethindrone-Ethinyl Estradiol-Fe Biphas (LO LOESTRIN FE) 1 MG-10 MCG / 10 MCG tablet Take 1 tablet by mouth daily. 84 tablet 4   Social History   Socioeconomic History  . Marital status: Single    Spouse name: Not on file  . Number of children: Not on file  . Years of education: Not on file  . Highest education level: Not on file  Occupational History  . Not on file  Tobacco Use  . Smoking status: Never Smoker  . Smokeless tobacco: Never Used  Vaping Use  . Vaping Use: Never used  Substance and Sexual Activity  . Alcohol use: No    Alcohol/week: 0.0 standard drinks  . Drug use: No  . Sexual activity: Yes    Birth control/protection: Pill  Other Topics Concern  . Not  on file  Social History Narrative  . Not on file   Social Determinants of Health   Financial Resource Strain: Low Risk   . Difficulty of Paying Living Expenses: Not very hard  Food Insecurity: No Food Insecurity  . Worried About Programme researcher, broadcasting/film/video in the Last Year: Never true  . Ran Out of Food in the Last Year: Never true  Transportation Needs: No Transportation Needs  . Lack of Transportation (Medical): No  . Lack of Transportation (Non-Medical): No  Physical Activity: Insufficiently Active  . Days of Exercise per Week: 6 days  . Minutes of Exercise per Session: 20 min  Stress: Stress Concern Present  . Feeling of Stress : To some extent   Social Connections: Moderately Isolated  . Frequency of Communication with Friends and Family: Three times a week  . Frequency of Social Gatherings with Friends and Family: Twice a week  . Attends Religious Services: Never  . Active Member of Clubs or Organizations: No  . Attends Banker Meetings: Never  . Marital Status: Living with partner  Intimate Partner Violence: Not At Risk  . Fear of Current or Ex-Partner: No  . Emotionally Abused: No  . Physically Abused: No  . Sexually Abused: No   Family History  Problem Relation Age of Onset  . Healthy Mother   . Arthritis Maternal Grandmother     OBJECTIVE:  Vitals:   07/02/20 1830  BP: 125/79  Pulse: 88  Resp: 16  Temp: 98.4 F (36.9 C)  TempSrc: Oral  SpO2: 97%     General appearance: alert; appears fatigued, but nontoxic; speaking in full sentences and tolerating own secretions HEENT: NCAT; Ears: EACs clear, TMs pearly gray; Eyes: PERRL.  EOM grossly intact. Sinuses: nontender; Nose: nares patent without rhinorrhea, Throat: oropharynx mildly erythematous, cobblestoning present, tonsils non erythematous or enlarged, uvula midline  Neck: supple without LAD Lungs: unlabored respirations, symmetrical air entry; cough: absent; no respiratory distress; CTAB Heart: regular rate and rhythm.  Radial pulses 2+ symmetrical bilaterally Skin: warm and dry Psychological: alert and cooperative; normal mood and affect  LABS:  No results found for this or any previous visit (from the past 24 hour(s)).   ASSESSMENT & PLAN:  1. Viral illness   2. Cough   3. Nasal congestion   4. Left ear pain   5. Sinus pain   6. Sore throat     Meds ordered this encounter  Medications  . azithromycin (ZITHROMAX) 250 MG tablet    Sig: Take 1 tablet (250 mg total) by mouth daily. Take first 2 tablets together, then 1 every day until finished.    Dispense:  6 tablet    Refill:  0    Fill 07/04/20    Order Specific Question:    Supervising Provider    Answer:   Merrilee Jansky [[id number]]   Prescribed azithromycin to be filled on 07/04/20 if viral testing is negative and symptoms have not improved   COVID testing ordered.  It will take between 1-2 days for test results.  Someone will contact you regarding abnormal results.   Work note provided Patient should remain in quarantine until they have received Covid results.  If negative you may resume normal activities (go back to work/school) while practicing hand hygiene, social distance, and mask wearing.  If positive, patient should remain in quarantine for 10 days from symptom onset AND greater than 72 hours after symptoms resolution (absence of fever without the use of  fever-reducing medication and improvement in respiratory symptoms), whichever is longer Get plenty of rest and push fluids Use OTC zyrtec for nasal congestion, runny nose, and/or sore throat Use OTC flonase for nasal congestion and runny nose Use medications daily for symptom relief Use OTC medications like ibuprofen or tylenol as needed fever or pain Call or go to the ED if you have any new or worsening symptoms such as fever, worsening cough, shortness of breath, chest tightness, chest pain, turning blue, changes in mental status.  Reviewed expectations re: course of current medical issues. Questions answered. Outlined signs and symptoms indicating need for more acute intervention. Patient verbalized understanding. After Visit Summary given.         Moshe Cipro, NP 07/05/20 [phone]

## 2020-07-03 LAB — COVID-19, FLU A+B NAA
Influenza A, NAA: NOT DETECTED
Influenza B, NAA: NOT DETECTED
SARS-CoV-2, NAA: NOT DETECTED

## 2020-07-06 ENCOUNTER — Telehealth: Payer: Self-pay | Admitting: Emergency Medicine

## 2020-07-17 ENCOUNTER — Other Ambulatory Visit: Payer: Self-pay

## 2020-07-17 ENCOUNTER — Encounter: Payer: Self-pay | Admitting: Emergency Medicine

## 2020-07-17 ENCOUNTER — Ambulatory Visit
Admission: EM | Admit: 2020-07-17 | Discharge: 2020-07-17 | Disposition: A | Discharge status: Home / Self Care | Payer: Medicaid Other | Attending: Family Medicine | Admitting: Family Medicine

## 2020-07-17 DIAGNOSIS — R519 Headache, unspecified: Secondary | ICD-10-CM

## 2020-07-17 DIAGNOSIS — H66001 Acute suppurative otitis media without spontaneous rupture of ear drum, right ear: Secondary | ICD-10-CM | POA: Diagnosis not present

## 2020-07-17 DIAGNOSIS — R509 Fever, unspecified: Secondary | ICD-10-CM | POA: Diagnosis not present

## 2020-07-17 DIAGNOSIS — H66004 Acute suppurative otitis media without spontaneous rupture of ear drum, recurrent, right ear: Secondary | ICD-10-CM

## 2020-07-17 DIAGNOSIS — R52 Pain, unspecified: Secondary | ICD-10-CM | POA: Diagnosis not present

## 2020-07-17 DIAGNOSIS — Z20822 Contact with and (suspected) exposure to covid-19: Secondary | ICD-10-CM | POA: Diagnosis not present

## 2020-07-17 MED ORDER — AMOXICILLIN-POT CLAVULANATE 875-125 MG PO TABS: 1.0000 | ORAL_TABLET | Freq: Two times a day (BID) | ORAL | 0 refills | Status: AC

## 2020-07-17 NOTE — Discharge Instructions (Signed)
Your COVID test is pending.  You should self quarantine until the test result is back.    Take Tylenol or ibuprofen as needed for fever or discomfort.  Rest and keep yourself hydrated.    Follow-up with your primary care provider if your symptoms are not improving.     

## 2020-07-17 NOTE — ED Provider Notes (Signed)
[facility]   [id number] 07/17/20 Arrival Time: 1903   CC: COVID symptoms  SUBJECTIVE: History from: patient.  Zoe House is a 20 y.o. female who presents with body aches, headache, fever that started last night. Reports that her daughter is sick as well as her mother in law. Was seen in theis office on 07/02/20 with negative Covid and flu testing. Was treated with azithromycin. Reports that she did get better with the medication. Denies sick exposure to COVID, flu or strep. Denies recent travel. Has negative history of Covid. Has not completed Covid vaccines. Has ibuprofen with some fever relief. There are no aggravating or alleviating factors. Denies previous symptoms in the past. Denies  sinus pain, rhinorrhea, sore throat, SOB, wheezing, chest pain, nausea, changes in bowel or bladder habits.    ROS: As per HPI.  All other pertinent ROS negative.     Past Medical History:  Diagnosis Date  . Asthma   . Eczema    Past Surgical History:  Procedure Laterality Date  . ADENOIDECTOMY    . TONSILLECTOMY    . tubes in ears    . TYMPANOSTOMY TUBE PLACEMENT     Allergies  Allergen Reactions  . Other Other (See Comments)    Pt states that she is allergic to trees, dogs, cats, mold, grass, dust, and 52 other outside sources.  Pt states that she is NOT allergic to any kind of medication or food.     No current facility-administered medications on file prior to encounter.   Current Outpatient Medications on File Prior to Encounter  Medication Sig Dispense Refill  . albuterol (PROVENTIL) (2.5 MG/3ML) 0.083% nebulizer solution Take 3 mLs (2.5 mg total) by nebulization every 4 (four) hours as needed for wheezing or shortness of breath. 75 mL 1  . cetirizine-pseudoephedrine (ZYRTEC-D) 5-120 MG tablet Take 1 tablet by mouth daily. 30 tablet 0  . EPINEPHrine (EPIPEN 2-PAK) 0.3 mg/0.3 mL IJ SOAJ injection Inject 0.3 mLs (0.3 mg total) into the muscle as needed for anaphylaxis. 2  each 1  . fluticasone (FLONASE) 50 MCG/ACT nasal spray Place 2 sprays into both nostrils daily. (Patient taking differently: Place 2 sprays into both nostrils daily. Uses as needed.) 1 g 5  . fluticasone (FLOVENT HFA) 110 MCG/ACT inhaler Inhale 1 puff into the lungs 2 (two) times daily. 12 g 5  . levocetirizine (XYZAL) 5 MG tablet TAKE 1 TABLET(5 MG) BY MOUTH DAILY 30 tablet 5  . mometasone (ELOCON) 0.1 % cream APPLY EXTERNALLY TO THE AFFECTED AREA TWICE DAILY AS NEEDED FOR ECZEMA 45 g 5  . montelukast (SINGULAIR) 10 MG tablet Take 1 tablet (10 mg total) by mouth at bedtime. 30 tablet 11  . mupirocin ointment (BACTROBAN) 2 % Apply 1 application topically 2 (two) times daily. (Patient not taking: Reported on 04/09/2020) 22 g 0  . Norethindrone-Ethinyl Estradiol-Fe Biphas (LO LOESTRIN FE) 1 MG-10 MCG / 10 MCG tablet Take 1 tablet by mouth daily. 84 tablet 4   Social History   Socioeconomic History  . Marital status: Single    Spouse name: Not on file  . Number of children: Not on file  . Years of education: Not on file  . Highest education level: Not on file  Occupational History  . Not on file  Tobacco Use  . Smoking status: Never Smoker  . Smokeless tobacco: Never Used  Vaping Use  . Vaping Use: Never used  Substance and Sexual Activity  . Alcohol use: No  Alcohol/week: 0.0 standard drinks  . Drug use: No  . Sexual activity: Yes    Birth control/protection: Pill  Other Topics Concern  . Not on file  Social History Narrative  . Not on file   Social Determinants of Health   Financial Resource Strain: Low Risk   . Difficulty of Paying Living Expenses: Not very hard  Food Insecurity: No Food Insecurity  . Worried About Programme researcher, broadcasting/film/video in the Last Year: Never true  . Ran Out of Food in the Last Year: Never true  Transportation Needs: No Transportation Needs  . Lack of Transportation (Medical): No  . Lack of Transportation (Non-Medical): No  Physical Activity:  Insufficiently Active  . Days of Exercise per Week: 6 days  . Minutes of Exercise per Session: 20 min  Stress: Stress Concern Present  . Feeling of Stress : To some extent  Social Connections: Moderately Isolated  . Frequency of Communication with Friends and Family: Three times a week  . Frequency of Social Gatherings with Friends and Family: Twice a week  . Attends Religious Services: Never  . Active Member of Clubs or Organizations: No  . Attends Banker Meetings: Never  . Marital Status: Living with partner  Intimate Partner Violence: Not At Risk  . Fear of Current or Ex-Partner: No  . Emotionally Abused: No  . Physically Abused: No  . Sexually Abused: No   Family History  Problem Relation Age of Onset  . Healthy Mother   . Arthritis Maternal Grandmother     OBJECTIVE:  Vitals:   07/17/20 2013  BP: 105/60  Pulse: 100  Resp: 18  Temp: 100.2 F (37.9 C)  TempSrc: Oral  SpO2: 97%  Weight: 165 lb (74.8 kg)  Height: 5\' 2"  (1.575 m)     General appearance: alert; appears fatigued, but nontoxic; speaking in full sentences and tolerating own secretions HEENT: NCAT; Ears: EACs clear, L TM pearly gray, R TM erythematous, bulging, with effusion; Eyes: PERRL.  EOM grossly intact. Sinuses: nontender; Nose: nares patent with clear rhinorrhea, Throat: oropharynx erythematous, cobblestoning present, tonsils non erythematous or enlarged, uvula midline  Neck: supple without LAD Lungs: unlabored respirations, symmetrical air entry; cough: mild; no respiratory distress; CTAB Heart: regular rate and rhythm.  Radial pulses 2+ symmetrical bilaterally Skin: warm and dry Psychological: alert and cooperative; normal mood and affect  LABS:  No results found for this or any previous visit (from the past 24 hour(s)).   ASSESSMENT & PLAN:  1. Non-recurrent acute suppurative otitis media of right ear without spontaneous rupture of tympanic membrane   2. Exposure to COVID-19  virus   3. Body aches   4. Fever, unspecified fever cause   5. Nonintractable headache, unspecified chronicity pattern, unspecified headache type     Meds ordered this encounter  Medications  . amoxicillin-clavulanate (AUGMENTIN) 875-125 MG tablet    Sig: Take 1 tablet by mouth 2 (two) times daily for 7 days.    Dispense:  14 tablet    Refill:  0    Order Specific Question:   Supervising Provider    Answer:   Merrilee Jansky   Augmentin BID x 7 days prescribed for R otitis media Continue supportive care at home COVID and flu testing ordered.  It will take between 2-3 days for test results. Someone will contact you regarding abnormal results.   Patient should remain in quarantine until they have received Covid results.  If negative you may resume  normal activities (go back to work/school) while practicing hand hygiene, social distance, and mask wearing.  If positive, patient should remain in quarantine for at least 5 days from symptom onset AND greater than 72 hours after symptoms resolution (absence of fever without the use of fever-reducing medication and improvement in respiratory symptoms), whichever is longer Get plenty of rest and push fluids Use OTC zyrtec for nasal congestion, runny nose, and/or sore throat Use OTC flonase for nasal congestion and runny nose Use medications daily for symptom relief Use OTC medications like ibuprofen or tylenol as needed fever or pain Call or go to the ED if you have any new or worsening symptoms such as fever, worsening cough, shortness of breath, chest tightness, chest pain, turning blue, changes in mental status.  Reviewed expectations re: course of current medical issues. Questions answered. Outlined signs and symptoms indicating need for more acute intervention. Patient verbalized understanding. After Visit Summary given.         Moshe Cipro, NP 07/18/20 1139

## 2020-07-17 NOTE — ED Triage Notes (Signed)
Body aches and headache, fever started last night.

## 2020-07-20 LAB — COVID-19, FLU A+B NAA
Influenza A, NAA: NOT DETECTED
Influenza B, NAA: NOT DETECTED
SARS-CoV-2, NAA: DETECTED — AB

## 2020-07-24 ENCOUNTER — Telehealth: Payer: Self-pay

## 2020-07-24 MED ORDER — FLUCONAZOLE 150 MG PO TABS: ORAL_TABLET | ORAL | 1 refills | Status: DC

## 2020-07-24 NOTE — Telephone Encounter (Signed)
Pt stated that she was put on antibiotics at Urgent Care and now has a yeast infection, she also stated that she is COVID +

## 2020-07-24 NOTE — Telephone Encounter (Signed)
Pt has been on antibiotics and now has a yeast infection. Can you send in Diflucan? Thanks!! JSY

## 2020-07-24 NOTE — Telephone Encounter (Signed)
Will rx diflucan  

## 2020-10-10 ENCOUNTER — Encounter: Payer: Self-pay | Admitting: Allergy & Immunology

## 2020-10-10 ENCOUNTER — Ambulatory Visit (INDEPENDENT_AMBULATORY_CARE_PROVIDER_SITE_OTHER): Payer: Medicaid Other | Admitting: Allergy & Immunology

## 2020-10-10 ENCOUNTER — Other Ambulatory Visit: Payer: Self-pay

## 2020-10-10 VITALS — BP 106/74 | HR 80 | Temp 98.2°F | Resp 12 | Ht 62.6 in | Wt 167.0 lb

## 2020-10-10 DIAGNOSIS — J3089 Other allergic rhinitis: Secondary | ICD-10-CM

## 2020-10-10 DIAGNOSIS — J4540 Moderate persistent asthma, uncomplicated: Secondary | ICD-10-CM | POA: Diagnosis not present

## 2020-10-10 DIAGNOSIS — J302 Other seasonal allergic rhinitis: Secondary | ICD-10-CM

## 2020-10-10 MED ORDER — AZELASTINE HCL 0.1 % NA SOLN: 1.0000 | Freq: Two times a day (BID) | NASAL | 5 refills | Status: DC

## 2020-10-10 NOTE — Patient Instructions (Addendum)
1. Moderate persistent asthma, uncomplicated - Lung testing looks great today. - Daily controller medication(s): Flovent 1 puffs twice daily with spacer - Prior to physical activity: ProAir 2 puffs 10-15 minutes before physical activity. - Rescue medications: ProAir 4 puffs every 4-6 hours as needed - Changes during respiratory infections or worsening symptoms: Increase Flovent to 4 puffs twice daily for TWO WEEKS. - Asthma control goals:  * Full participation in all desired activities (may need albuterol before activity) * Albuterol use two time or less a week on average (not counting use with activity) * Cough interfering with sleep two time or less a month * Oral steroids no more than once a year * No hospitalizations  2. Seasonal and perennial allergic rhinitis (grasses, weeds, trees, molds, dust mites, cat, dog) - Restart the montelukast.   - Continue with Xyzal 5mg  daily (you can take an extra one on particularly bad days).  - Continue with fluticasone nasal spray one spray per nostril daily. - Add on Astelin one spray per nostril twice daily (works WITH the Flonase to help with the congestion).   3. Eczema - Continue with moisturizer twice daily.  - Continue with Elocon as needed twice daily.   4. Return in about 3 months (around 01/09/2021).    Please inform 03/12/2021 of any Emergency Department visits, hospitalizations, or changes in symptoms. Call us before going to the ED for breathing or allergy symptoms since we might be able to fit you in for a sick visit. Feel free to contact us anytime with any questions, problems, or concerns.  It was a pleasure to see you again today!  Websites that have reliable patient information: 1. American Academy of Asthma, Allergy, and Immunology: www.aaaai.org 2. Food Allergy Research and Education (FARE): foodallergy.org 3. Mothers of Asthmatics: http://www.asthmacommunitynetwork.org 4. American College of Allergy, Asthma, and  Immunology: www.acaai.org   COVID-19 Vaccine Information can be found at: [place] For questions related to vaccine distribution or appointments, please email [email or web address] or call [phone].   We realize that you might be concerned about having an allergic reaction to the COVID19 vaccines. To help with that concern, WE ARE OFFERING THE COVID19 VACCINES IN OUR OFFICE! Ask the front desk for dates!     "Like" [phone] on Facebook and Instagram for our latest updates!      A healthy democracy works best when [place] participate! Make sure you are registered to vote! If you have moved or changed any of your contact information, you will need to get this updated before voting!  In some cases, you MAY be able to register to vote online: [employer]
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
## 2020-11-08 ENCOUNTER — Other Ambulatory Visit: Payer: Self-pay

## 2020-11-08 ENCOUNTER — Ambulatory Visit
Admission: EM | Admit: 2020-11-08 | Discharge: 2020-11-08 | Disposition: A | Discharge status: Home / Self Care | Payer: Medicaid Other | Attending: Family Medicine | Admitting: Family Medicine

## 2020-11-08 ENCOUNTER — Encounter: Payer: Self-pay | Admitting: Emergency Medicine

## 2020-11-08 DIAGNOSIS — R0981 Nasal congestion: Secondary | ICD-10-CM

## 2020-11-08 DIAGNOSIS — J019 Acute sinusitis, unspecified: Secondary | ICD-10-CM

## 2020-11-08 MED ORDER — PREDNISONE 20 MG PO TABS: 20.0000 mg | ORAL_TABLET | Freq: Every day | ORAL | 0 refills | Status: AC

## 2020-11-08 MED ORDER — CEFDINIR 300 MG PO CAPS: 600.0000 mg | ORAL_CAPSULE | Freq: Every day | ORAL | 0 refills | Status: AC

## 2020-11-08 MED ORDER — PROMETHAZINE-DM 6.25-15 MG/5ML PO SYRP: 5.0000 mL | ORAL_SOLUTION | Freq: Four times a day (QID) | ORAL | 0 refills | Status: DC | PRN

## 2020-11-08 NOTE — ED Provider Notes (Signed)
[facility]    CSN: [id number] Arrival date & time: 11/08/20  0827      History   Chief Complaint Chief Complaint  Patient presents with  . Nasal Congestion    HPI Zoe House is a 20 y.o. female.   HPI  Patient presents with URI symptoms including nasal congestion, runny nose, and postnasal drip induced cough.  Endorses a headache.  Afebrile.  History of recurrent rhinitis and recurrent allergy induced sinusitis.  Symptoms present for greater than 7 days. Thought initially allergies. History of asthma however no acute asthma symptoms. Denies worrisome symptoms of shortness of breath, weakness, N&V, chest pain I  Past Medical History:  Diagnosis Date  . Asthma   . Eczema     Patient Active Problem List   Diagnosis Date Noted  . Allergic rhinitis 10/19/2019  . Moderate persistent asthma, uncomplicated 05/17/2017  . Seasonal and perennial allergic rhinitis 05/17/2017  . Asthma 11/12/2015    Past Surgical History:  Procedure Laterality Date  . ADENOIDECTOMY    . TONSILLECTOMY    . tubes in ears    . TYMPANOSTOMY TUBE PLACEMENT      OB History    Gravida  1   Para  1   Term  1   Preterm      AB      Living  1     SAB      IAB      Ectopic      Multiple  0   Live Births  1            Home Medications    Prior to Admission medications   Medication Sig Start Date End Date Taking? Authorizing Provider  cefdinir (OMNICEF) 300 MG capsule Take 2 capsules (600 mg total) by mouth daily for 7 days. 11/08/20 11/15/20 Yes Bing Neighbors, FNP  predniSONE (DELTASONE) 20 MG tablet Take 1 tablet (20 mg total) by mouth daily with breakfast for 5 days. 11/08/20 11/13/20 Yes Bing Neighbors, FNP  promethazine-dextromethorphan (PROMETHAZINE-DM) 6.25-15 MG/5ML syrup Take 5 mLs by mouth 4 (four) times daily as needed for cough. 11/08/20  Yes Bing Neighbors, FNP  albuterol (PROVENTIL) (2.5 MG/3ML) 0.083% nebulizer solution Take 3 mLs (2.5 mg  total) by nebulization every 4 (four) hours as needed for wheezing or shortness of breath. 08/22/19   Alfonse Spruce, MD  azelastine (ASTELIN) 0.1 % nasal spray Place 1 spray into both nostrils 2 (two) times daily. Use in each nostril as directed 10/10/20 11/09/20  Alfonse Spruce, MD  cetirizine-pseudoephedrine (ZYRTEC-D) 5-120 MG tablet Take 1 tablet by mouth daily. Patient not taking: Reported on 10/10/2020 05/13/20   Wurst, [place], PA-C  EPINEPHrine (EPIPEN 2-PAK) 0.3 mg/0.3 mL IJ SOAJ injection Inject 0.3 mLs (0.3 mg total) into the muscle as needed for anaphylaxis. 10/19/19   Hetty Blend, FNP  fluconazole (DIFLUCAN) 150 MG tablet Take 1 now and 1 in 3 days Patient not taking: Reported on 10/10/2020 07/24/20   Adline Potter, NP  fluticasone [facility]) 50 MCG/ACT nasal spray Place 2 sprays into both nostrils daily. Patient taking differently: Place 2 sprays into both nostrils daily. Uses as needed. 04/20/19   Alfonse Spruce, MD  fluticasone (FLOVENT HFA) 110 MCG/ACT inhaler Inhale 1 puff into the lungs 2 (two) times daily. Patient taking differently: Inhale 2 puffs into the lungs daily. 04/23/20   Alfonse Spruce, MD  levocetirizine (XYZAL) 5 MG tablet TAKE 1 TABLET(5  MG) BY MOUTH DAILY 04/23/20   Alfonse Spruce, MD  mometasone (ELOCON) 0.1 % cream APPLY EXTERNALLY TO THE AFFECTED AREA TWICE DAILY AS NEEDED FOR ECZEMA 06/09/20   Ambs, Norvel Richards, FNP  montelukast (SINGULAIR) 10 MG tablet Take 1 tablet (10 mg total) by mouth at bedtime. 04/20/19 05/20/19  Alfonse Spruce, MD  mupirocin ointment (BACTROBAN) 2 % Apply 1 application topically 2 (two) times daily. Patient not taking: No sig reported 03/04/20   Wurst, [place], PA-C  Norethindrone-Ethinyl Estradiol-Fe Biphas (LO LOESTRIN FE) 1 MG-10 MCG / 10 MCG tablet Take 1 tablet by mouth daily. 04/30/20   Arabella Merles, CNM    Family History Family History  Problem Relation Age of Onset  . Healthy Mother   .  Arthritis Maternal Grandmother     Social History Social History   Tobacco Use  . Smoking status: Never Smoker  . Smokeless tobacco: Never Used  Vaping Use  . Vaping Use: Never used  Substance Use Topics  . Alcohol use: No    Alcohol/week: 0.0 standard drinks  . Drug use: No     Allergies   Other   Review of Systems Review of Systems Pertinent negatives listed in HPI  Physical Exam Triage Vital Signs ED Triage Vitals  Enc Vitals Group     BP 11/08/20 0844 118/84     Pulse Rate 11/08/20 0844 88     Resp 11/08/20 0844 18     Temp 11/08/20 0846 98 F (36.7 C)     Temp Source 11/08/20 0846 Oral     SpO2 11/08/20 0844 98 %     Weight --      Height --      Head Circumference --      Peak Flow --      Pain Score 11/08/20 0846 0     Pain Loc --      Pain Edu? --      Excl. in GC? --    No data found.  Updated Vital Signs BP 118/84   Pulse 88   Temp 98 F (36.7 C) (Oral)   Resp 18   SpO2 98%   Visual Acuity Right Eye Distance:   Left Eye Distance:   Bilateral Distance:    Right Eye Near:   Left Eye Near:    Bilateral Near:     Physical Exam  General Appearance:    Alert, cooperative, no distress  HENT:   Normocephalic, ears normal, nares mucosal edema with congestion, rhinorrhea, oropharynx normal   Eyes:    PERRL, conjunctiva/corneas clear, EOM's intact       Lungs:     Clear to auscultation bilaterally, respirations unlabored  Heart:    Regular rate and rhythm  Neurologic:   Awake, alert, oriented x 3. No apparent focal neurological           defect.      UC Treatments / Results  Labs (all labs ordered are listed, but only abnormal results are displayed) Labs Reviewed - No data to display  EKG   Radiology No results found.  Procedures Procedures (including critical care time)  Medications Ordered in UC Medications - No data to display  Initial Impression / Assessment and Plan / UC Course  I have reviewed the triage vital signs  and the nursing notes.  Pertinent labs & imaging results that were available during my care of the patient were reviewed by me and considered in my medical decision  making (see chart for details).     Acute sinusitis with persistent nasal congestion treatment per discharge medication orders.  Continue daily antihistamine therapy.  Follow-up with PCP as needed. Final Clinical Impressions(s) / UC Diagnoses   Final diagnoses:  Acute non-recurrent sinusitis, unspecified location  Nasal congestion   Discharge Instructions   None    ED Prescriptions    Medication Sig Dispense Auth. Provider   cefdinir (OMNICEF) 300 MG capsule Take 2 capsules (600 mg total) by mouth daily for 7 days. 14 capsule Bing Neighbors, FNP   predniSONE (DELTASONE) 20 MG tablet Take 1 tablet (20 mg total) by mouth daily with breakfast for 5 days. 5 tablet Bing Neighbors, FNP   promethazine-dextromethorphan (PROMETHAZINE-DM) 6.25-15 MG/5ML syrup Take 5 mLs by mouth 4 (four) times daily as needed for cough. 140 mL Bing Neighbors, FNP     PDMP not reviewed this encounter.   Bing Neighbors, FNP 11/08/20 1229

## 2020-11-08 NOTE — ED Triage Notes (Signed)
Congestion x 5 days

## 2020-11-21 ENCOUNTER — Ambulatory Visit
Admission: EM | Admit: 2020-11-21 | Discharge: 2020-11-21 | Disposition: A | Discharge status: Home / Self Care | Payer: Medicaid Other | Attending: Emergency Medicine | Admitting: Emergency Medicine

## 2020-11-21 ENCOUNTER — Encounter: Payer: Self-pay | Admitting: Emergency Medicine

## 2020-11-21 ENCOUNTER — Other Ambulatory Visit: Payer: Self-pay

## 2020-11-21 DIAGNOSIS — R0981 Nasal congestion: Secondary | ICD-10-CM

## 2020-11-21 MED ORDER — CETIRIZINE-PSEUDOEPHEDRINE ER 5-120 MG PO TB12: 1.0000 | ORAL_TABLET | Freq: Every day | ORAL | 0 refills | Status: DC

## 2020-11-21 MED ORDER — FLUTICASONE PROPIONATE 50 MCG/ACT NA SUSP: 2.0000 | Freq: Every day | NASAL | 0 refills | Status: DC

## 2020-11-21 NOTE — Discharge Instructions (Signed)
Declines covid test Get plenty of rest and push fluids Zyrtec d for congestion Use medications daily for symptom relief Use OTC medications like ibuprofen or tylenol as needed fever or pain Call or go to the ED if you have any new or worsening symptoms such as fever, cough, shortness of breath, chest tightness, chest pain, turning blue, changes in mental status, etc..Marland Kitchen

## 2020-11-21 NOTE — ED Provider Notes (Signed)
[facility]   [id number] 11/21/20 Arrival Time: 0905   CC: COVID symptoms  SUBJECTIVE: History from: patient.  WILENA TYNDALL is a 20 y.o. female who presents with sinus congestion with yellow mucus x 2 days.  Daughter with similar symptoms.  Denies alleviating or aggravating factors.  Reports previous symptoms in the past.   Denies fever, chills, fatigue, SOB, wheezing, chest pain, nausea, changes in bowel or bladder habits.    ROS: As per HPI.  All other pertinent ROS negative.     Past Medical History:  Diagnosis Date  . Asthma   . Eczema    Past Surgical History:  Procedure Laterality Date  . ADENOIDECTOMY    . TONSILLECTOMY    . tubes in ears    . TYMPANOSTOMY TUBE PLACEMENT     Allergies  Allergen Reactions  . Other Other (See Comments)    Pt states that she is allergic to trees, dogs, cats, mold, grass, dust, and 52 other outside sources.  Pt states that she is NOT allergic to any kind of medication or food.     No current facility-administered medications on file prior to encounter.   Current Outpatient Medications on File Prior to Encounter  Medication Sig Dispense Refill  . albuterol (PROVENTIL) (2.5 MG/3ML) 0.083% nebulizer solution Take 3 mLs (2.5 mg total) by nebulization every 4 (four) hours as needed for wheezing or shortness of breath. 75 mL 1  . EPINEPHrine (EPIPEN 2-PAK) 0.3 mg/0.3 mL IJ SOAJ injection Inject 0.3 mLs (0.3 mg total) into the muscle as needed for anaphylaxis. 2 each 1  . fluconazole (DIFLUCAN) 150 MG tablet Take 1 now and 1 in 3 days (Patient not taking: Reported on 10/10/2020) 2 tablet 1  . fluticasone (FLOVENT HFA) 110 MCG/ACT inhaler Inhale 1 puff into the lungs 2 (two) times daily. (Patient taking differently: Inhale 2 puffs into the lungs daily.) 12 g 5  . mometasone (ELOCON) 0.1 % cream APPLY EXTERNALLY TO THE AFFECTED AREA TWICE DAILY AS NEEDED FOR ECZEMA 45 g 5  . montelukast (SINGULAIR) 10 MG tablet Take 1 tablet (10 mg  total) by mouth at bedtime. 30 tablet 11  . mupirocin ointment (BACTROBAN) 2 % Apply 1 application topically 2 (two) times daily. (Patient not taking: No sig reported) 22 g 0  . Norethindrone-Ethinyl Estradiol-Fe Biphas (LO LOESTRIN FE) 1 MG-10 MCG / 10 MCG tablet Take 1 tablet by mouth daily. 84 tablet 4  . [DISCONTINUED] azelastine (ASTELIN) 0.1 % nasal spray Place 1 spray into both nostrils 2 (two) times daily. Use in each nostril as directed 30 mL 5  . [DISCONTINUED] levocetirizine (XYZAL) 5 MG tablet TAKE 1 TABLET(5 MG) BY MOUTH DAILY 30 tablet 5   Social History   Socioeconomic History  . Marital status: Single    Spouse name: Not on file  . Number of children: Not on file  . Years of education: Not on file  . Highest education level: Not on file  Occupational History  . Not on file  Tobacco Use  . Smoking status: Never Smoker  . Smokeless tobacco: Never Used  Vaping Use  . Vaping Use: Never used  Substance and Sexual Activity  . Alcohol use: No    Alcohol/week: 0.0 standard drinks  . Drug use: No  . Sexual activity: Yes    Birth control/protection: Pill  Other Topics Concern  . Not on file  Social History Narrative  . Not on file   Social Determinants of Health  Financial Resource Strain: Low Risk   . Difficulty of Paying Living Expenses: Not very hard  Food Insecurity: No Food Insecurity  . Worried About Programme researcher, broadcasting/film/video in the Last Year: Never true  . Ran Out of Food in the Last Year: Never true  Transportation Needs: No Transportation Needs  . Lack of Transportation (Medical): No  . Lack of Transportation (Non-Medical): No  Physical Activity: Insufficiently Active  . Days of Exercise per Week: 6 days  . Minutes of Exercise per Session: 20 min  Stress: Stress Concern Present  . Feeling of Stress : To some extent  Social Connections: Moderately Isolated  . Frequency of Communication with Friends and Family: Three times a week  . Frequency of Social  Gatherings with Friends and Family: Twice a week  . Attends Religious Services: Never  . Active Member of Clubs or Organizations: No  . Attends Banker Meetings: Never  . Marital Status: Living with partner  Intimate Partner Violence: Not At Risk  . Fear of Current or Ex-Partner: No  . Emotionally Abused: No  . Physically Abused: No  . Sexually Abused: No   Family History  Problem Relation Age of Onset  . Healthy Mother   . Arthritis Maternal Grandmother     OBJECTIVE:  Vitals:   11/21/20 0931  BP: 120/80  Pulse: 93  Resp: 18  Temp: 98.4 F (36.9 C)  TempSrc: Oral  SpO2: 96%     General appearance: alert; well-appearing, nontoxic; speaking in full sentences and tolerating own secretions HEENT: NCAT; Ears: EACs clear, TMs pearly gray; Eyes: PERRL.  EOM grossly intact.Nose: nares patent without rhinorrhea, Throat: oropharynx clear, tonsils non erythematous or enlarged, uvula midline  Neck: supple without LAD Lungs: unlabored respirations, symmetrical air entry; cough: absent; no respiratory distress; CTAB Heart: regular rate and rhythm.  Skin: warm and dry Psychological: alert and cooperative; normal mood and affect   ASSESSMENT & PLAN:  1. Sinus congestion     Meds ordered this encounter  Medications  . cetirizine-pseudoephedrine (ZYRTEC-D) 5-120 MG tablet    Sig: Take 1 tablet by mouth daily.    Dispense:  30 tablet    Refill:  0    Order Specific Question:   Supervising Provider    Answer:   Eustace Moore [[id number]]  . fluticasone (FLONASE) 50 MCG/ACT nasal spray    Sig: Place 2 sprays into both nostrils daily.    Dispense:  16 g    Refill:  0    Order Specific Question:   Supervising Provider    Answer:   Eustace Moore [[id number]]    Declines covid test Get plenty of rest and push fluids Zyrtec d for congestion Use medications daily for symptom relief Use OTC medications like ibuprofen or tylenol as needed fever or pain Call or go  to the ED if you have any new or worsening symptoms such as fever, cough, shortness of breath, chest tightness, chest pain, turning blue, changes in mental status, etc...   Reviewed expectations re: course of current medical issues. Questions answered. Outlined signs and symptoms indicating need for more acute intervention. Patient verbalized understanding. After Visit Summary given.         Rennis Harding, PA-C 11/21/20 1032

## 2020-11-21 NOTE — ED Triage Notes (Signed)
Yellow sinus congestion x 2 days.

## 2020-12-22 ENCOUNTER — Other Ambulatory Visit: Payer: Self-pay

## 2020-12-22 MED ORDER — ALBUTEROL SULFATE (2.5 MG/3ML) 0.083% IN NEBU: 2.5000 mg | INHALATION_SOLUTION | RESPIRATORY_TRACT | 1 refills | Status: AC | PRN

## 2020-12-22 NOTE — Telephone Encounter (Signed)
Patient called needing a refill for albuterol nebulizer solution. It has been sent in with 1 refill to the [facility] on [address] in [place]. She was last seen 10/10/20 and has OV visit in July of 2022.

## 2020-12-28 ENCOUNTER — Other Ambulatory Visit: Payer: Self-pay | Admitting: Allergy & Immunology

## 2020-12-29 NOTE — Telephone Encounter (Signed)
Patient was last seen on 10/10/20 on that note it states she is to use zyzal daily. It was last filled 04/23/20 and on 11/21/20 was discontinued by [place]. Please advise.

## 2021-01-14 ENCOUNTER — Ambulatory Visit (INDEPENDENT_AMBULATORY_CARE_PROVIDER_SITE_OTHER): Payer: Medicaid Other | Admitting: Allergy & Immunology

## 2021-01-14 ENCOUNTER — Other Ambulatory Visit: Payer: Self-pay

## 2021-01-14 ENCOUNTER — Encounter: Payer: Self-pay | Admitting: Allergy & Immunology

## 2021-01-14 VITALS — BP 118/70 | HR 96 | Temp 98.8°F | Resp 18 | Ht 62.0 in | Wt 171.0 lb

## 2021-01-14 DIAGNOSIS — J3089 Other allergic rhinitis: Secondary | ICD-10-CM

## 2021-01-14 DIAGNOSIS — J4540 Moderate persistent asthma, uncomplicated: Secondary | ICD-10-CM

## 2021-01-14 DIAGNOSIS — L232 Allergic contact dermatitis due to cosmetics: Secondary | ICD-10-CM | POA: Diagnosis not present

## 2021-01-14 DIAGNOSIS — L2089 Other atopic dermatitis: Secondary | ICD-10-CM

## 2021-01-14 DIAGNOSIS — J302 Other seasonal allergic rhinitis: Secondary | ICD-10-CM | POA: Diagnosis not present

## 2021-01-14 MED ORDER — BUDESONIDE-FORMOTEROL FUMARATE 160-4.5 MCG/ACT IN AERO: 2.0000 | INHALATION_SPRAY | Freq: Two times a day (BID) | RESPIRATORY_TRACT | 5 refills | Status: DC

## 2021-01-14 MED ORDER — MOMETASONE FUROATE 0.1 % EX CREA: TOPICAL_CREAM | CUTANEOUS | 5 refills | Status: DC

## 2021-01-14 MED ORDER — LEVOCETIRIZINE DIHYDROCHLORIDE 5 MG PO TABS
5.0000 mg | ORAL_TABLET | Freq: Every evening | ORAL | 5 refills | Status: DC
Start: 2021-01-14 — End: 2021-08-03

## 2021-01-14 MED ORDER — CLOBETASOL PROPIONATE 0.05 % EX OINT: 1.0000 "application " | TOPICAL_OINTMENT | Freq: Two times a day (BID) | CUTANEOUS | 2 refills | Status: DC

## 2021-01-14 NOTE — Progress Notes (Signed)
FOLLOW UP  Date of Service/Encounter:  01/14/21   Assessment:   Moderate persistent asthma, uncomplicated   Seasonal and perennial allergic rhinitis (grasses, weeds, trees, molds, dust mites, cat, dog)  Eczema  Contact dermatitis in bilateral axilla   Plan/Recommendations:   1. Moderate persistent asthma, uncomplicated - Lung testing looks great today. - Stop the Flovent and start Symbicort (contains a steroid and two additional medications to help keep your lungs open).   - Daily controller medication(s): Symbicort two puffs twice daily with spacer - Prior to physical activity: ProAir 2 puffs 10-15 minutes before physical activity. - Rescue medications: ProAir 4 puffs every 4-6 hours as needed - Changes during respiratory infections or worsening symptoms: Add on Flovent to 4 puffs twice daily for TWO WEEKS. - Asthma control goals:  * Full participation in all desired activities (may need albuterol before activity) * Albuterol use two time or less a week on average (not counting use with activity) * Cough interfering with sleep two time or less a month * Oral steroids no more than once a year * No hospitalizations  2. Seasonal and perennial allergic rhinitis (grasses, weeds, trees, molds, dust mites, cat, dog) - Continue with montelukast 10mg .  - Continue with Xyzal 5mg  daily (you can take an extra one on particularly bad days).  - Continue with fluticasone nasal spray one spray per nostril daily.  3. Eczema - Continue with moisturizer twice daily.  - Continue with Elocon as needed twice daily.   4. Return in about 4 months (around 05/17/2021).   Subjective:   Zoe House is a 20 y.o. female presenting today for follow up of  Chief Complaint  Patient presents with   Asthma    ACT -23 Only has difficulty with asthma during heat or physical activity    Eczema    Red bumps on arms and under arms not sure if it is eczema related     Zoe House  has a history of the following: Patient Active Problem List   Diagnosis Date Noted   Allergic rhinitis 10/19/2019   Moderate persistent asthma, uncomplicated 05/17/2017   Seasonal and perennial allergic rhinitis 05/17/2017   Asthma 11/12/2015    History obtained from: chart review and patient.  Zoe House is a 20 y.o. female presenting for a follow up visit.  She was last seen in April 2022.  At that time, her lung testing looked great.  We continue with Flovent 1 puff twice daily with albuterol as needed.  For her allergic rhinitis, we recommended restarting the montelukast to see if this helped.  We continued with Xyzal 5 mg daily, taken an extra 1 on a particularly bad days.  Eczema was under good control moisturizers as needed and Elocon as needed.  In the interim, she has had multiple sinus infections.   Asthma/Respiratory Symptom History: Asthma is controlled as long as she is not doing anything "crazy". She does have the Flovent  two puffs twice daily. She did have to get some nebulizer solution to help with flares. She does have problems when she is around dust and such. She has never been on Symbicort.  She is open to new ideas.  She would like to be able to do more in her activities of daily living so that she can keep up with her household and her 81-year-old daughter.  Allergic Rhinitis Symptom History: She has been using her nose spray  (fluticasone) one spray per nostril once daily.  She did try Astelin but she did not like the taste at all. She is on Xyzal that she takes daily.  At this point, allergy shots are still not an option.  She does not have regular reliable transportation.  Eczema Symptom History: She is having some eczema flares in her underarms.  She is wondering if it has something to do with her deodorant, but she has been using the same deodorant for years.  This is an aluminum free Dove sensitive bar.  She has tried using Tom's deodorant bars in the past but they have not  provided the protection.  Her mother is very much into natural remedies, but the natural remedies have not worked well for her. She has never been patch tested.  She has not tried using Elocon under her arms.  She thought it might have been too strong.  She has had no drainage.  She denies any enlarged lymph nodes on her arms.  Her daughter is four and she is going to be starting preschool next year. She shows me pictures and her daughter clearly love the limelight!  Otherwise, there have been no changes to her past medical history, surgical history, family history, or social history.    Review of Systems  Constitutional: Negative.  Negative for chills, fever, malaise/fatigue and weight loss.  HENT: Negative.  Negative for congestion, ear discharge, ear pain, sinus pain and sore throat.   Eyes:  Negative for pain, discharge and redness.  Respiratory:  Positive for shortness of breath and wheezing. Negative for cough and sputum production.   Cardiovascular: Negative.  Negative for chest pain and palpitations.  Gastrointestinal:  Negative for abdominal pain, constipation, diarrhea, heartburn, nausea and vomiting.  Skin: Negative.  Negative for itching and rash.  Neurological:  Negative for dizziness and headaches.  Endo/Heme/Allergies:  Negative for environmental allergies. Does not bruise/bleed easily.      Objective:   Blood pressure 118/70, pulse 96, temperature 98.8 F (37.1 C), resp. rate 18, height 5\' 2"  (1.575 m), weight 171 lb (77.6 kg), SpO2 97 %. Body mass index is 31.28 kg/m.   Physical Exam:  Physical Exam Constitutional:      Appearance: She is well-developed.     Comments: Very pleasant.  HENT:     Head: Normocephalic and atraumatic.     Right Ear: Tympanic membrane, ear canal and external ear normal.     Left Ear: Tympanic membrane, ear canal and external ear normal.     Nose: No nasal deformity, septal deviation, mucosal edema or rhinorrhea.     Right Turbinates:  Enlarged and swollen.     Left Turbinates: Enlarged and swollen.     Right Sinus: No maxillary sinus tenderness or frontal sinus tenderness.     Left Sinus: No maxillary sinus tenderness or frontal sinus tenderness.     Mouth/Throat:     Mouth: Mucous membranes are not pale and not dry.     Pharynx: Uvula midline.  Eyes:     General: Lids are normal. No allergic shiner.       Right eye: No discharge.        Left eye: No discharge.     Conjunctiva/sclera: Conjunctivae normal.     Right eye: Right conjunctiva is not injected. No chemosis.    Left eye: Left conjunctiva is not injected. No chemosis.    Pupils: Pupils are equal, round, and reactive to light.  Cardiovascular:     Rate and Rhythm: Normal rate and regular  rhythm.     Heart sounds: Normal heart sounds.  Pulmonary:     Effort: Pulmonary effort is normal. No tachypnea, accessory muscle usage or respiratory distress.     Breath sounds: Normal breath sounds. No wheezing, rhonchi or rales.     Comments: Moving air well in all lung fields.  No increased work of breathing. Chest:     Chest wall: No tenderness.  Lymphadenopathy:     Cervical: No cervical adenopathy.  Skin:    Coloration: Skin is not pale.     Findings: Rash present. No abrasion, erythema or petechiae. Rash is not papular, urticarial or vesicular.     Comments: She does have some an erythematous rash under her bilateral arms in the axilla.  There are couple of pustules.  There is no sloughing or vesicle formation.  Neurological:     Mental Status: She is alert.  Psychiatric:        Behavior: Behavior is cooperative.     Diagnostic studies:    Spirometry: results normal (FEV1: 2.33/75%, FVC: 3.00/85%, FEV1/FVC: 78%).    Spirometry consistent with normal pattern.   Allergy Studies: none        Malachi Bonds, MD  [facility]

## 2021-01-14 NOTE — Patient Instructions (Addendum)
1. Moderate persistent asthma, uncomplicated - Lung testing looks great today. - Stop the Flovent and start Symbicort (contains a steroid and two additional medications to help keep your lungs open).   - Daily controller medication(s): Symbicort two puffs twice daily with spacer - Prior to physical activity: ProAir 2 puffs 10-15 minutes before physical activity. - Rescue medications: ProAir 4 puffs every 4-6 hours as needed - Changes during respiratory infections or worsening symptoms: Add on Flovent to 4 puffs twice daily for TWO WEEKS. - Asthma control goals:  * Full participation in all desired activities (may need albuterol before activity) * Albuterol use two time or less a week on average (not counting use with activity) * Cough interfering with sleep two time or less a month * Oral steroids no more than once a year * No hospitalizations  2. Seasonal and perennial allergic rhinitis (grasses, weeds, trees, molds, dust mites, cat, dog) - Continue with montelukast 10mg .  - Continue with Xyzal 5mg  daily (you can take an extra one on particularly bad days).  - Continue with fluticasone nasal spray one spray per nostril daily.  3. Eczema - Continue with moisturizer twice daily.  - Continue with Elocon as needed twice daily.   4. Return in about 4 months (around 05/17/2021).    Please inform of any Emergency Department visits, hospitalizations, or changes in symptoms. Call 05/19/2021 before going to the ED for breathing or allergy symptoms since we might be able to fit you in for a sick visit. Feel free to contact us anytime with any questions, problems, or concerns.  It was a pleasure to see you again today!  Websites that have reliable patient information: 1. American Academy of Asthma, Allergy, and Immunology: www.aaaai.org 2. Food Allergy Research and Education (FARE): foodallergy.org 3. Mothers of Asthmatics: http://www.asthmacommunitynetwork.org 4. American College of  Allergy, Asthma, and Immunology: www.acaai.org   COVID-19 Vaccine Information can be found at: [place] For questions related to vaccine distribution or appointments, please email [email or web address] or call [phone].   We realize that you might be concerned about having an allergic reaction to the COVID19 vaccines. To help with that concern, WE ARE OFFERING THE COVID19 VACCINES IN OUR OFFICE! Ask the front desk for dates!     "Like" [email or web address] on Facebook and Instagram for our latest updates!      A healthy democracy works best when [phone] participate! Make sure you are registered to vote! If you have moved or changed any of your contact information, you will need to get this updated before voting!  In some cases, you MAY be able to register to vote online: [place]

## 2021-01-15 ENCOUNTER — Encounter: Payer: Self-pay | Admitting: Allergy & Immunology

## 2021-01-29 DIAGNOSIS — E559 Vitamin D deficiency, unspecified: Secondary | ICD-10-CM | POA: Diagnosis not present

## 2021-01-29 DIAGNOSIS — R1030 Lower abdominal pain, unspecified: Secondary | ICD-10-CM | POA: Diagnosis not present

## 2021-01-29 DIAGNOSIS — Z136 Encounter for screening for cardiovascular disorders: Secondary | ICD-10-CM | POA: Diagnosis not present

## 2021-01-29 DIAGNOSIS — R5383 Other fatigue: Secondary | ICD-10-CM | POA: Diagnosis not present

## 2021-01-29 DIAGNOSIS — Z13220 Encounter for screening for lipoid disorders: Secondary | ICD-10-CM | POA: Diagnosis not present

## 2021-01-29 DIAGNOSIS — Z1159 Encounter for screening for other viral diseases: Secondary | ICD-10-CM | POA: Diagnosis not present

## 2021-01-29 DIAGNOSIS — F418 Other specified anxiety disorders: Secondary | ICD-10-CM | POA: Diagnosis not present

## 2021-02-19 DIAGNOSIS — J029 Acute pharyngitis, unspecified: Secondary | ICD-10-CM | POA: Diagnosis not present

## 2021-02-19 DIAGNOSIS — R059 Cough, unspecified: Secondary | ICD-10-CM | POA: Diagnosis not present

## 2021-03-17 DIAGNOSIS — Z23 Encounter for immunization: Secondary | ICD-10-CM | POA: Diagnosis not present

## 2021-03-17 DIAGNOSIS — F418 Other specified anxiety disorders: Secondary | ICD-10-CM | POA: Diagnosis not present

## 2021-04-23 DIAGNOSIS — F418 Other specified anxiety disorders: Secondary | ICD-10-CM | POA: Diagnosis not present

## 2021-05-20 ENCOUNTER — Ambulatory Visit: Payer: Medicaid Other | Admitting: Allergy & Immunology

## 2021-06-01 ENCOUNTER — Telehealth: Payer: Self-pay | Admitting: Women's Health

## 2021-06-01 ENCOUNTER — Other Ambulatory Visit: Payer: Self-pay

## 2021-06-01 DIAGNOSIS — Z3009 Encounter for other general counseling and advice on contraception: Secondary | ICD-10-CM

## 2021-06-01 MED ORDER — LO LOESTRIN FE 1 MG-10 MCG / 10 MCG PO TABS: 1.0000 | ORAL_TABLET | Freq: Every day | ORAL | 0 refills | Status: DC

## 2021-06-01 NOTE — Telephone Encounter (Signed)
Patient called and set up her pap/physical appointment today for 07/01/21 with Clelia Croft but will run out of birth control tomorrow. She wanted to see if a refill could be called in to last her til her appointment in January.

## 2021-07-01 ENCOUNTER — Other Ambulatory Visit: Payer: Medicaid Other | Admitting: Advanced Practice Midwife

## 2021-07-03 ENCOUNTER — Ambulatory Visit (INDEPENDENT_AMBULATORY_CARE_PROVIDER_SITE_OTHER): Payer: Medicaid Other | Admitting: Adult Health

## 2021-07-03 ENCOUNTER — Encounter: Payer: Self-pay | Admitting: Adult Health

## 2021-07-03 ENCOUNTER — Other Ambulatory Visit: Payer: Self-pay

## 2021-07-03 VITALS — BP 115/66 | HR 71 | Ht 62.0 in | Wt 163.0 lb

## 2021-07-03 DIAGNOSIS — Z01419 Encounter for gynecological examination (general) (routine) without abnormal findings: Secondary | ICD-10-CM

## 2021-07-03 DIAGNOSIS — Z113 Encounter for screening for infections with a predominantly sexual mode of transmission: Secondary | ICD-10-CM | POA: Diagnosis not present

## 2021-07-03 DIAGNOSIS — Z3009 Encounter for other general counseling and advice on contraception: Secondary | ICD-10-CM

## 2021-07-03 DIAGNOSIS — Z3041 Encounter for surveillance of contraceptive pills: Secondary | ICD-10-CM | POA: Diagnosis not present

## 2021-07-03 MED ORDER — LO LOESTRIN FE 1 MG-10 MCG / 10 MCG PO TABS: 1.0000 | ORAL_TABLET | Freq: Every day | ORAL | 4 refills | Status: DC

## 2021-07-03 NOTE — Progress Notes (Signed)
Patient ID: Zoe House, female   DOB: [birth year], 20 y.o.   MRN: [id number] History of Present Illness: Zoe House is a 20 year old white female,single, G1P1 in for well woman gyn exam.  Lab Results  Component Value Date   DIAGPAP  04/30/2020    - Negative for intraepithelial lesion or malignancy (NILM)    Current Medications, Allergies, Past Medical History, Past Surgical History, Family History and Social History were reviewed in [employer] record.     Review of Systems: Patient denies any headaches, hearing loss, fatigue, blurred vision, shortness of breath, chest pain, abdominal pain, problems with bowel movements, urination, or intercourse. No joint pain or mood swings.  She is happy with her OCs   Physical Exam:BP 115/66 (BP Location: Left Arm, Patient Position: Sitting, Cuff Size: Normal)    Pulse 71    Ht 5\' 2"  (1.575 m)    Wt 163 lb (73.9 kg)    LMP 06/29/2021    BMI 29.81 kg/m   General:  Well developed, well nourished, no acute distress Skin:  Warm and dry Neck:  Midline trachea, normal thyroid, good ROM, no lymphadenopathy Lungs; Clear to auscultation bilaterally Breast:  No dominant palpable mass, retraction, or nipple discharge Cardiovascular: Regular rate and rhythm Abdomen:  Soft, non tender, no hepatosplenomegaly Pelvic:  External genitalia is normal in appearance, no lesions.  The vagina is normal in appearance. Urethra has no lesions or masses. The cervix is smooth.  Uterus is felt to be normal size, shape, and contour.  No adnexal masses or tenderness noted.Bladder is non tender, no masses felt. Extremities/musculoskeletal:  No swelling or varicosities noted, no clubbing or cyanosis Psych:  No mood changes, alert and cooperative,seems happy AA is 1 Fall risk is low Depression screen Zoe House 2/9 07/03/2021 04/30/2020 03/30/2018  Decreased Interest 1 0 0  Down, Depressed, Hopeless 1 0 0  PHQ - 2 Score 2 0 0  Altered sleeping 0 0 -  Tired,  decreased energy 3 2 -  Change in appetite 0 0 -  Feeling bad or failure about yourself  0 0 -  Trouble concentrating 0 0 -  Moving slowly or fidgety/restless 0 0 -  Suicidal thoughts 0 0 -  PHQ-9 Score 5 2 -    GAD 7 : Generalized Anxiety Score 07/03/2021 04/30/2020  Nervous, Anxious, on Edge 1 2  Control/stop worrying 1 2  Worry too much - different things 1 2  Trouble relaxing 1 1  Restless 0 0  Easily annoyed or irritable 1 1  Afraid - awful might happen 0 0  Total GAD 7 Score 5 8  On Zoloft     Upstream - 07/03/21 07/05/21       Pregnancy Intention Screening   Does the patient want to become pregnant in the next year? No    Does the patient's partner want to become pregnant in the next year? No    Would the patient like to discuss contraceptive options today? No      Contraception Wrap Up   Current Method Oral Contraceptive    End Method Oral Contraceptive    Contraception Counseling Provided No            Examination chaperoned by [id number] LPN  Impression and Plan:  1. Family planning  2. Encounter for surveillance of contraceptive pills Will refill lo Loestrin Meds ordered this encounter  Medications   Norethindrone-Ethinyl Estradiol-Fe Biphas (LO LOESTRIN FE) 1 MG-10 MCG / 10  MCG tablet    Sig: Take 1 tablet by mouth daily.    Dispense:  84 tablet    Refill:  4    Order Specific Question:   Supervising Provider    Answer:   Despina Hidden, LUTHER H [[id number]]     3. Encounter for well woman exam with routine gynecological exam Physical in 1 year Pap in 2024  4. Screening examination for STD (sexually transmitted disease) Urine sent for GC/CHL

## 2021-07-05 LAB — GC/CHLAMYDIA PROBE AMP
Chlamydia trachomatis, NAA: NEGATIVE
Neisseria Gonorrhoeae by PCR: NEGATIVE

## 2021-08-03 ENCOUNTER — Other Ambulatory Visit: Payer: Self-pay | Admitting: Allergy & Immunology

## 2021-10-22 DIAGNOSIS — Z0000 Encounter for general adult medical examination without abnormal findings: Secondary | ICD-10-CM | POA: Diagnosis not present

## 2021-10-22 DIAGNOSIS — Z23 Encounter for immunization: Secondary | ICD-10-CM | POA: Diagnosis not present

## 2021-10-22 DIAGNOSIS — E785 Hyperlipidemia, unspecified: Secondary | ICD-10-CM | POA: Diagnosis not present

## 2021-10-22 DIAGNOSIS — Z760 Encounter for issue of repeat prescription: Secondary | ICD-10-CM | POA: Diagnosis not present

## 2021-10-22 DIAGNOSIS — R5383 Other fatigue: Secondary | ICD-10-CM | POA: Diagnosis not present

## 2021-10-22 DIAGNOSIS — Z79899 Other long term (current) drug therapy: Secondary | ICD-10-CM | POA: Diagnosis not present

## 2021-11-06 ENCOUNTER — Other Ambulatory Visit: Payer: Self-pay | Admitting: Women's Health

## 2021-11-06 DIAGNOSIS — Z3009 Encounter for other general counseling and advice on contraception: Secondary | ICD-10-CM

## 2021-12-03 DIAGNOSIS — F418 Other specified anxiety disorders: Secondary | ICD-10-CM | POA: Diagnosis not present

## 2022-02-01 ENCOUNTER — Encounter: Payer: Self-pay | Admitting: Family Medicine

## 2022-02-01 ENCOUNTER — Ambulatory Visit (INDEPENDENT_AMBULATORY_CARE_PROVIDER_SITE_OTHER): Payer: Medicaid Other | Admitting: Family Medicine

## 2022-02-01 VITALS — BP 122/78 | HR 92 | Temp 97.3°F | Resp 18 | Ht 62.0 in | Wt 159.5 lb

## 2022-02-01 DIAGNOSIS — J302 Other seasonal allergic rhinitis: Secondary | ICD-10-CM

## 2022-02-01 DIAGNOSIS — J4540 Moderate persistent asthma, uncomplicated: Secondary | ICD-10-CM | POA: Diagnosis not present

## 2022-02-01 DIAGNOSIS — H1013 Acute atopic conjunctivitis, bilateral: Secondary | ICD-10-CM

## 2022-02-01 DIAGNOSIS — J3089 Other allergic rhinitis: Secondary | ICD-10-CM | POA: Diagnosis not present

## 2022-02-01 DIAGNOSIS — L2089 Other atopic dermatitis: Secondary | ICD-10-CM | POA: Diagnosis not present

## 2022-02-01 DIAGNOSIS — H1010 Acute atopic conjunctivitis, unspecified eye: Secondary | ICD-10-CM

## 2022-02-01 MED ORDER — ALBUTEROL SULFATE HFA 108 (90 BASE) MCG/ACT IN AERS: 2.0000 | INHALATION_SPRAY | Freq: Four times a day (QID) | RESPIRATORY_TRACT | 2 refills | Status: DC | PRN

## 2022-02-01 MED ORDER — RYALTRIS 665-25 MCG/ACT NA SUSP: NASAL | 5 refills | Status: DC

## 2022-02-01 MED ORDER — MOMETASONE FUROATE 0.1 % EX CREA: TOPICAL_CREAM | CUTANEOUS | 5 refills | Status: DC

## 2022-02-01 MED ORDER — MONTELUKAST SODIUM 10 MG PO TABS: 10.0000 mg | ORAL_TABLET | Freq: Every day | ORAL | 5 refills | Status: DC

## 2022-02-01 MED ORDER — BUDESONIDE-FORMOTEROL FUMARATE 160-4.5 MCG/ACT IN AERO: 2.0000 | INHALATION_SPRAY | Freq: Two times a day (BID) | RESPIRATORY_TRACT | 5 refills | Status: DC

## 2022-02-01 MED ORDER — OLOPATADINE HCL 0.2 % OP SOLN: 1.0000 [drp] | Freq: Every day | OPHTHALMIC | 5 refills | Status: DC | PRN

## 2022-02-01 NOTE — Patient Instructions (Addendum)
Asthma Restart montelukast 10 mg once a day to prevent cough or wheeze  Continue Symbicort 160-2 puffs twice a day with a spacer to prevent cough or wheeze Continue albuterol 2 puffs once every 4 hours as needed for cough or wheeze You may use albuterol 2 puffs 5 to 15 minutes before activity to decrease cough or wheeze  Allergic rhinitis Continue allergen avoidance measures directed toward pollen, mold, dust mite, and pets as listed below Begin Allegra 180 mg once a day as needed for runny nose or itch Begin Ryaltris 2 sprays in each nostril up to twice a day a day as needed for nasal symptoms Begin saline nasal rinses as needed for nasal symptoms. Use this before any medicated nasal sprays for best result  Allergic conjunctivitis Begin olopatadine 1 drop in each eye once a day as needed for red or itchy eyes  Atopic dermatitis Continue a twice a day moisturizing routine Continue Elocon to red and itchy areas underneath your face up to twice a day as needed.  Do not use this medication for longer than 2 weeks in a row  Call the clinic if this treatment plan is not working well for you.  Follow up in 6 months or sooner if needed.  Reducing Pollen Exposure The American Academy of Allergy, Asthma and Immunology suggests the following steps to reduce your exposure to pollen during allergy seasons. Do not hang sheets or clothing out to dry; pollen may collect on these items. Do not mow lawns or spend time around freshly cut grass; mowing stirs up pollen. Keep windows closed at night.  Keep car windows closed while driving. Minimize morning activities outdoors, a time when pollen counts are usually at their highest. Stay indoors as much as possible when pollen counts or humidity is high and on windy days when pollen tends to remain in the air longer. Use air conditioning when possible.  Many air conditioners have filters that trap the pollen spores. Use a HEPA room air filter to remove  pollen form the indoor air you breathe.   Control of Dust Mite Allergen Dust mites play a major role in allergic asthma and rhinitis. They occur in environments with high humidity wherever human skin is found. Dust mites absorb humidity from the atmosphere (ie, they do not drink) and feed on organic matter (including shed human and animal skin). Dust mites are a microscopic type of insect that you cannot see with the naked eye. High levels of dust mites have been detected from mattresses, pillows, carpets, upholstered furniture, bed covers, clothes, soft toys and any woven material. The principal allergen of the dust mite is found in its feces. A gram of dust may contain 1,000 mites and 250,000 fecal particles. Mite antigen is easily measured in the air during house cleaning activities. Dust mites do not bite and do not cause harm to humans, other than by triggering allergies/asthma.  Ways to decrease your exposure to dust mites in your home:  1. Encase mattresses, box springs and pillows with a mite-impermeable barrier or cover  2. Wash sheets, blankets and drapes weekly in hot water (130 F) with detergent and dry them in a dryer on the hot setting.  3. Have the room cleaned frequently with a vacuum cleaner and a damp dust-mop. For carpeting or rugs, vacuuming with a vacuum cleaner equipped with a high-efficiency particulate air (HEPA) filter. The dust mite allergic individual should not be in a room which is being cleaned and should wait  1 hour after cleaning before going into the room.  4. Do not sleep on upholstered furniture (eg, couches).  5. If possible removing carpeting, upholstered furniture and drapery from the home is ideal. Horizontal blinds should be eliminated in the rooms where the person spends the most time (bedroom, study, television room). Washable vinyl, roller-type shades are optimal.  6. Remove all non-washable stuffed toys from the bedroom. Wash stuffed toys weekly like  sheets and blankets above.  7. Reduce indoor humidity to less than 50%. Inexpensive humidity monitors can be purchased at most hardware stores. Do not use a humidifier as can make the problem worse and are not recommended.  Control of Dog or Cat Allergen Avoidance is the best way to manage a dog or cat allergy. If you have a dog or cat and are allergic to dog or cats, consider removing the dog or cat from the home. If you have a dog or cat but don't want to find it a new home, or if your family wants a pet even though someone in the household is allergic, here are some strategies that may help keep symptoms at bay:  Keep the pet out of your bedroom and restrict it to only a few rooms. Be advised that keeping the dog or cat in only one room will not limit the allergens to that room. Don't pet, hug or kiss the dog or cat; if you do, wash your hands with soap and water. High-efficiency particulate air (HEPA) cleaners run continuously in a bedroom or living room can reduce allergen levels over time. Regular use of a high-efficiency vacuum cleaner or a central vacuum can reduce allergen levels. Giving your dog or cat a bath at least once a week can reduce airborne allergen.

## 2022-02-01 NOTE — Progress Notes (Signed)
[address] [place] [address] Dept: [phone]  FOLLOW UP NOTE  Patient ID: Zoe House, female    DOB: 04-26-2001  Age: 21 y.o. MRN: [id number] Date of Office Visit: 02/01/2022  Assessment  Chief Complaint: Asthma, Eczema, Allergic Rhinitis  (Got a new cat. Constant sneezing, sometimes itchy eyes, stuffy/runny nose. /Has tried an extra dose of her antihistamine. ), and Medication Refill (Elocon)  HPI [name] is a 21 year old female who presents the clinic for follow-up visit.  She was last seen in this clinic on 01/14/2022 by Dr. Dellis Anes for evaluation of asthma, allergic rhinitis, atopic dermatitis, and contact dermatitis in bilateral axilla area.  At today's visit, she reports her asthma has been moderately well controlled with occasional shortness of breath with activity.  She denies shortness of breath, cough, or wheeze with moderate activity or rest.  She continues Symbicort 160-2 puffs twice a day with a spacer and has not used albuterol in greater than 6 months.  She does report that she had taken montelukast several years ago with no problems, however, stopped this medication when she became pregnant with her daughter.  Allergic rhinitis is reported as poorly controlled with symptoms including clear rhinorrhea, nasal congestion, and sneezing.  She continues Flonase daily, Xyzal 5 mg once a day, and an over-the-counter allergy medication that she does not know the name of at this time.  She is not currently using saline nasal rinses.  She does report that she has been taking Xyzal for about 1 year.  Allergy testing greater than 5 years ago indicates positive results to grass pollen, weed pollen, tree pollen, mold, dust mite, cat, and dog.  She does report that she has started allergy injections 2 or 3 times in the past and struggled with transportation and time commitments.  She is not interested in restarting allergy injections at this time. Allergic  conjunctivitis is reported as poorly controlled with symptoms including red, itchy, and watery eyes.  Atopic dermatitis is reported as moderately well controlled with red and itchy areas occurring in a flare in remission pattern on her hands, fingers, and legs.  She continues a twice a day moisturizing routine and uses mometasone topical ointment as needed with relief of symptoms.  Her current medications are listed in the chart.    Drug Allergies:  Allergies  Allergen Reactions   Other Other (See Comments)    Pt states that she is allergic to trees, dogs, cats, mold, grass, dust, and 52 other outside sources.  Pt states that she is NOT allergic to any kind of medication or food.      Physical Exam: BP 122/78   Pulse 92   Temp (!) 97.3 F (36.3 C)   Resp 18   Ht 5\' 2"  (1.575 m)   Wt 159 lb 8 oz (72.3 kg)   SpO2 96%   BMI 29.17 kg/m    Physical Exam Vitals reviewed.  Constitutional:      Appearance: Normal appearance.  HENT:     Head: Normocephalic and atraumatic.     Right Ear: Tympanic membrane normal.     Left Ear: Tympanic membrane normal.     Nose: Nose normal.     Comments: Bilateral nares slightly erythematous with clear nasal drainage noted.  Pharynx normal.  Ears normal.  Eyes normal.    Mouth/Throat:     Pharynx: Oropharynx is clear.  Eyes:     Conjunctiva/sclera: Conjunctivae normal.  Cardiovascular:  Rate and Rhythm: Normal rate and regular rhythm.     Heart sounds: Normal heart sounds. No murmur heard. Pulmonary:     Effort: Pulmonary effort is normal.     Breath sounds: Normal breath sounds.     Comments: Lungs clear to auscultation Musculoskeletal:        General: Normal range of motion.     Cervical back: Normal range of motion and neck supple.  Skin:    General: Skin is warm and dry.  Neurological:     Mental Status: She is alert and oriented to person, place, and time.  Psychiatric:        Mood and Affect: Mood normal.        Behavior: Behavior  normal.        Thought Content: Thought content normal.        Judgment: Judgment normal.     Diagnostics: FVC 2.89, FEV1 2.36.  Predicted FVC 3.55, predicted FEV1 3.09.  Spirometry indicates normal ventilatory function.  Assessment and Plan: 1. Moderate persistent asthma, uncomplicated   2. Seasonal and perennial allergic rhinitis   3. Flexural atopic dermatitis   4. Seasonal allergic conjunctivitis     Meds ordered this encounter  Medications   budesonide-formoterol (SYMBICORT) 160-4.5 MCG/ACT inhaler    Sig: Inhale 2 puffs into the lungs in the morning and at bedtime.    Dispense:  1 each    Refill:  5   montelukast (SINGULAIR) 10 MG tablet    Sig: Take 1 tablet (10 mg total) by mouth at bedtime.    Dispense:  30 tablet    Refill:  5   albuterol (VENTOLIN HFA) 108 (90 Base) MCG/ACT inhaler    Sig: Inhale 2 puffs into the lungs every 6 (six) hours as needed for wheezing or shortness of breath.    Dispense:  18 g    Refill:  2   mometasone (ELOCON) 0.1 % cream    Sig: APPLY EXTERNALLY TO THE AFFECTED AREA TWICE DAILY AS NEEDED FOR ECZEMA    Dispense:  45 g    Refill:  5   Olopatadine HCl 0.2 % SOLN    Sig: Apply 1 drop to eye daily as needed (for red or itchy eyes).    Dispense:  2.5 mL    Refill:  5   Olopatadine-Mometasone (RYALTRIS) 665-25 MCG/ACT SUSP    Sig: Do 2 sprays in each nostril up to twice a day as needed for nasal symptoms    Dispense:  29 g    Refill:  5    Patient Instructions  Asthma Restart montelukast 10 mg once a day to prevent cough or wheeze  Continue Symbicort 160-2 puffs twice a day with a spacer to prevent cough or wheeze Continue albuterol 2 puffs once every 4 hours as needed for cough or wheeze You may use albuterol 2 puffs 5 to 15 minutes before activity to decrease cough or wheeze  Allergic rhinitis Continue allergen avoidance measures directed toward pollen, mold, dust mite, and pets as listed below Begin Allegra 180 mg once a day as  needed for runny nose or itch Begin Ryaltris 2 sprays in each nostril up to twice a day a day as needed for nasal symptoms Begin saline nasal rinses as needed for nasal symptoms. Use this before any medicated nasal sprays for best result  Allergic conjunctivitis Begin olopatadine 1 drop in each eye once a day as needed for red or itchy eyes  Atopic dermatitis Continue  a twice a day moisturizing routine Continue Elocon to red and itchy areas underneath your face up to twice a day as needed.  Do not use this medication for longer than 2 weeks in a row  Call the clinic if this treatment plan is not working well for you.  Follow up in 6 months or sooner if needed.   Return in about 6 months (around 08/04/2022), or if symptoms worsen or fail to improve.    Thank you for the opportunity to care for this patient.  Please do not hesitate to contact me with questions.  Thermon Leyland, FNP [facility]

## 2022-02-15 ENCOUNTER — Telehealth: Payer: Self-pay | Admitting: Family Medicine

## 2022-02-15 MED ORDER — FEXOFENADINE HCL 180 MG PO TABS: 180.0000 mg | ORAL_TABLET | Freq: Every day | ORAL | 5 refills | Status: DC

## 2022-02-15 NOTE — Telephone Encounter (Signed)
Patient is requesting prescription for Allegra to be sent in.   [facility] - [address], [place]  Best contact number: [phone]

## 2022-02-15 NOTE — Telephone Encounter (Signed)
Yes please. Thank you

## 2022-02-15 NOTE — Telephone Encounter (Signed)
Refill as instructed and requested. Tried to call patient but the phone kept ringing. Will try again.

## 2022-02-15 NOTE — Telephone Encounter (Signed)
Okay to send [employer]

## 2022-02-17 NOTE — Telephone Encounter (Signed)
Tried to call patient and phone kept ringing until message stated that call cannot be completed.

## 2022-02-24 NOTE — Telephone Encounter (Signed)
Patient called back. I informed her of the refill at the pharmacy.

## 2022-03-13 DIAGNOSIS — Z20822 Contact with and (suspected) exposure to covid-19: Secondary | ICD-10-CM | POA: Diagnosis not present

## 2022-04-07 DIAGNOSIS — F411 Generalized anxiety disorder: Secondary | ICD-10-CM | POA: Diagnosis not present

## 2022-04-07 DIAGNOSIS — Z79899 Other long term (current) drug therapy: Secondary | ICD-10-CM | POA: Diagnosis not present

## 2022-04-07 DIAGNOSIS — F320 Major depressive disorder, single episode, mild: Secondary | ICD-10-CM | POA: Diagnosis not present

## 2022-05-06 DIAGNOSIS — F320 Major depressive disorder, single episode, mild: Secondary | ICD-10-CM | POA: Diagnosis not present

## 2022-05-06 DIAGNOSIS — F411 Generalized anxiety disorder: Secondary | ICD-10-CM | POA: Diagnosis not present

## 2022-06-03 DIAGNOSIS — F320 Major depressive disorder, single episode, mild: Secondary | ICD-10-CM | POA: Diagnosis not present

## 2022-06-03 DIAGNOSIS — F411 Generalized anxiety disorder: Secondary | ICD-10-CM | POA: Diagnosis not present

## 2022-06-13 DIAGNOSIS — J101 Influenza due to other identified influenza virus with other respiratory manifestations: Secondary | ICD-10-CM | POA: Diagnosis not present

## 2022-07-19 ENCOUNTER — Encounter: Payer: Self-pay | Admitting: Adult Health

## 2022-07-19 ENCOUNTER — Other Ambulatory Visit (HOSPITAL_COMMUNITY)
Admission: RE | Admit: 2022-07-19 | Discharge: 2022-07-19 | Disposition: A | Discharge status: Home / Self Care | Payer: Medicaid Other | Source: Ambulatory Visit | Attending: Adult Health | Admitting: Adult Health

## 2022-07-19 ENCOUNTER — Ambulatory Visit (INDEPENDENT_AMBULATORY_CARE_PROVIDER_SITE_OTHER): Payer: Medicaid Other | Admitting: Adult Health

## 2022-07-19 VITALS — BP 125/79 | HR 96 | Ht 62.0 in | Wt 153.8 lb

## 2022-07-19 DIAGNOSIS — Z3041 Encounter for surveillance of contraceptive pills: Secondary | ICD-10-CM

## 2022-07-19 DIAGNOSIS — Z01419 Encounter for gynecological examination (general) (routine) without abnormal findings: Secondary | ICD-10-CM | POA: Diagnosis not present

## 2022-07-19 DIAGNOSIS — Z3009 Encounter for other general counseling and advice on contraception: Secondary | ICD-10-CM | POA: Insufficient documentation

## 2022-07-19 MED ORDER — LO LOESTRIN FE 1 MG-10 MCG / 10 MCG PO TABS: 1.0000 | ORAL_TABLET | Freq: Every day | ORAL | 4 refills | Status: DC

## 2022-07-19 NOTE — Progress Notes (Signed)
Patient ID: Zoe House, female   DOB: 03-31-2001, 22 y.o.   MRN: [id number] History of Present Illness: Zoe House is a 22 year old white female,single, G1P1001, in for a well woman gyn exam and pap.    Current Medications, Allergies, Past Medical History, Past Surgical History, Family History and Social History were reviewed in [employer] record.     Review of Systems: Patient denies any headaches, hearing loss, fatigue, blurred vision, shortness of breath, chest pain, abdominal pain, problems with bowel movements, urination, or intercourse. No joint pain or mood swings.     Physical Exam:BP 125/79 (BP Location: Left Arm, Patient Position: Sitting, Cuff Size: Normal)   Pulse 96   Ht 5\' 2"  (1.575 m)   Wt 153 lb 12.8 oz (69.8 kg)   LMP 07/05/2022   BMI 28.13 kg/m   General:  Well developed, well nourished, no acute distress Skin:  Warm and dry Neck:  Midline trachea, normal thyroid, good ROM, no lymphadenopathy Lungs; Clear to auscultation bilaterally Breast:  No dominant palpable mass, retraction, or nipple discharge Cardiovascular: Regular rate and rhythm Abdomen:  Soft, non tender, no hepatosplenomegaly Pelvic:  External genitalia is normal in appearance, no lesions.  The vagina is normal in appearance. Urethra has no lesions or masses. The cervix is smooth, pap with GC/CHL performed.  Uterus is felt to be normal size, shape, and contour.  No adnexal masses or tenderness noted.Bladder is non tender, no masses felt. Extremities/musculoskeletal:  No swelling or varicosities noted, no clubbing or cyanosis Psych:  No mood changes, alert and cooperative,seems happy AA is 2 Fall risk is low    07/19/2022    2:53 PM 07/03/2021    9:23 AM 04/30/2020    3:54 PM  Depression screen PHQ 2/9  Decreased Interest 0 1 0  Down, Depressed, Hopeless 0 1 0  PHQ - 2 Score 0 2 0  Altered sleeping 0 0 0  Tired, decreased energy 3 3 2   Change in appetite 1 0 0  Feeling bad  or failure about yourself  0 0 0  Trouble concentrating 0 0 0  Moving slowly or fidgety/restless 1 0 0  Suicidal thoughts 0 0 0  PHQ-9 Score 5 5 2    She is on Wellbutrin     07/19/2022    2:56 PM 07/03/2021    9:23 AM 04/30/2020    3:54 PM  GAD 7 : Generalized Anxiety Score  Nervous, Anxious, on Edge 3 1 2   Control/stop worrying 3 1 2   Worry too much - different things 3 1 2   Trouble relaxing 1 1 1   Restless 0 0 0  Easily annoyed or irritable 3 1 1   Afraid - awful might happen 0 0 0  Total GAD 7 Score 13 5 8     Upstream - 07/19/22 1455       Pregnancy Intention Screening   Does the patient want to become pregnant in the next year? Unsure    Does the patient's partner want to become pregnant in the next year? Unsure    Would the patient like to discuss contraceptive options today? No      Contraception Wrap Up   Current Method Oral Contraceptive    End Method Oral Contraceptive    Contraception Counseling Provided No            Examination chaperoned by Celene Squibb LPN    Impression and Plan: 1. Encounter for gynecological examination with Papanicolaou smear of cervix  Pap sent Pap in 3 years if normal Physical in 1 year   2. Family planning  3. Encounter for surveillance of contraceptive pills Will continue lo Loestrin for now Meds ordered this encounter  Medications   Norethindrone-Ethinyl Estradiol-Fe Biphas (LO LOESTRIN FE) 1 MG-10 MCG / 10 MCG tablet    Sig: Take 1 tablet by mouth daily.    Dispense:  84 tablet    Refill:  4    Order Specific Question:   Supervising Provider    Answer:   Tania Ade H [[id number]]

## 2022-07-21 LAB — CYTOLOGY - PAP
Adequacy: ABSENT
Chlamydia: NEGATIVE
Comment: NEGATIVE
Comment: NORMAL
Diagnosis: NEGATIVE
Neisseria Gonorrhea: NEGATIVE

## 2022-08-01 DIAGNOSIS — B9689 Other specified bacterial agents as the cause of diseases classified elsewhere: Secondary | ICD-10-CM | POA: Diagnosis not present

## 2022-08-01 DIAGNOSIS — H109 Unspecified conjunctivitis: Secondary | ICD-10-CM | POA: Diagnosis not present

## 2022-08-04 ENCOUNTER — Ambulatory Visit: Payer: Medicaid Other | Admitting: Allergy & Immunology

## 2022-08-05 DIAGNOSIS — Z419 Encounter for procedure for purposes other than remedying health state, unspecified: Secondary | ICD-10-CM | POA: Diagnosis not present

## 2022-08-24 DIAGNOSIS — F411 Generalized anxiety disorder: Secondary | ICD-10-CM | POA: Diagnosis not present

## 2022-08-24 DIAGNOSIS — F320 Major depressive disorder, single episode, mild: Secondary | ICD-10-CM | POA: Diagnosis not present

## 2022-09-03 DIAGNOSIS — Z419 Encounter for procedure for purposes other than remedying health state, unspecified: Secondary | ICD-10-CM | POA: Diagnosis not present

## 2022-09-10 ENCOUNTER — Ambulatory Visit (INDEPENDENT_AMBULATORY_CARE_PROVIDER_SITE_OTHER): Payer: Medicaid Other | Admitting: Allergy & Immunology

## 2022-09-10 ENCOUNTER — Other Ambulatory Visit: Payer: Self-pay

## 2022-09-10 ENCOUNTER — Encounter: Payer: Self-pay | Admitting: Allergy & Immunology

## 2022-09-10 VITALS — BP 120/70 | HR 111 | Temp 98.0°F | Resp 20 | Ht 62.0 in | Wt 156.0 lb

## 2022-09-10 DIAGNOSIS — J3089 Other allergic rhinitis: Secondary | ICD-10-CM | POA: Diagnosis not present

## 2022-09-10 DIAGNOSIS — J302 Other seasonal allergic rhinitis: Secondary | ICD-10-CM

## 2022-09-10 DIAGNOSIS — L2089 Other atopic dermatitis: Secondary | ICD-10-CM | POA: Diagnosis not present

## 2022-09-10 DIAGNOSIS — J4540 Moderate persistent asthma, uncomplicated: Secondary | ICD-10-CM | POA: Diagnosis not present

## 2022-09-10 MED ORDER — MOMETASONE FUROATE 0.1 % EX CREA: TOPICAL_CREAM | CUTANEOUS | 5 refills | Status: DC

## 2022-09-10 MED ORDER — BUDESONIDE-FORMOTEROL FUMARATE 160-4.5 MCG/ACT IN AERO
2.0000 | INHALATION_SPRAY | Freq: Two times a day (BID) | RESPIRATORY_TRACT | 5 refills | Status: DC
Start: 2022-09-10 — End: 2023-03-21

## 2022-09-10 NOTE — Progress Notes (Signed)
FOLLOW UP  Date of Service/Encounter:  09/10/22   Assessment:   Moderate persistent asthma, uncomplicated   Seasonal and perennial allergic rhinitis (grasses, weeds, trees, molds, dust mites, cat, dog)   Eczema   Contact dermatitis in bilateral axilla  Plan/Recommendations:    There are no Patient Instructions on file for this visit.   Subjective:   Zoe House is a 22 y.o. female presenting today for follow up of No chief complaint on file.   Otilio Connors has a history of the following: Patient Active Problem List   Diagnosis Date Noted  . Encounter for surveillance of contraceptive pills 07/19/2022  . Encounter for gynecological examination with Papanicolaou smear of cervix 07/19/2022  . Family planning 07/19/2022  . Flexural atopic dermatitis 02/01/2022  . Seasonal allergic conjunctivitis 02/01/2022  . Allergic rhinitis 10/19/2019  . Moderate persistent asthma, uncomplicated [id number]  . Seasonal and perennial allergic rhinitis 05/17/2017  . Asthma 11/12/2015    History obtained from: chart review and patient.  Zoe House is a 22 y.o. female presenting for a follow up visit.  She was last seen in July 2023.  At that time, and recommended restarting her montelukast 10 mg once a day and continuing the Symbicort 160 mcg 2 puffs twice daily.  For her allergic rhinitis, she was started on Allegra and Ryaltris.  For her conjunctivitis, she was continued on Pataday as needed.  Her atopic dermatitis was under good control with Elocon twice daily as needed.  Since last visit, she has done very well. She is now working at [employer] and it is a lot less stressful. She is now going to school and she is getting a certificate in EKG technician. This is going to be a two month session.   Asthma/Respiratory Symptom History: Asthma is well controlled. She is currently taking the Symbicort two puffs BID. She is using her albuterol very rarely. She has not needed it in quite some  time. She has not been on prednisone. She denies nighttime coughing or wheezing.   Allergic Rhinitis Symptom History: These have been well controlled. She has noticed a huge difference when she went to visit her mother in [place]. They were there for 4-5 days and had a nice time.  She is only taking the Allegra daily. She is on the Flonase.   Skin Symptom History: Skin is under control. She is still using the mometasone and this is lasting longer than it used to.  {Blank single:19197::"GERD Symptom History: ***"," "}  Her daughter is in kindergarten.   Otherwise, there have been no changes to her past medical history, surgical history, family history, or social history.    ROS     Objective:   There were no vitals taken for this visit. There is no height or weight on file to calculate BMI.    Physical Exam   Diagnostic studies: {Blank single:19197::"none","deferred due to recent antihistamine use","labs sent instead"," "}  Spirometry: {Blank single:19197::"results normal (FEV1: ***%, FVC: ***%, FEV1/FVC: ***%)","results abnormal (FEV1: ***%, FVC: ***%, FEV1/FVC: ***%)"}.    {Blank single:19197::"Spirometry consistent with mild obstructive disease","Spirometry consistent with moderate obstructive disease","Spirometry consistent with severe obstructive disease","Spirometry consistent with possible restrictive disease","Spirometry consistent with mixed obstructive and restrictive disease","Spirometry uninterpretable due to technique","Spirometry consistent with normal pattern"}. {Blank single:19197::"Albuterol/Atrovent nebulizer","Xopenex/Atrovent nebulizer","Albuterol nebulizer","Albuterol four puffs via MDI","Xopenex four puffs via MDI"} treatment given in clinic with {Blank single:19197::"significant improvement in FEV1 per ATS criteria","significant improvement in FVC per ATS criteria","significant improvement in FEV1 and FVC  per ATS criteria","improvement in FEV1, but not  significant per ATS criteria","improvement in FVC, but not significant per ATS criteria","improvement in FEV1 and FVC, but not significant per ATS criteria","no improvement"}.  Allergy Studies: {Blank single:19197::"none","labs sent instead"," "}    {Blank single:19197::"Allergy testing results were read and interpreted by myself, documented by clinical staff."," "}      Salvatore Marvel, MD  [facility]

## 2022-09-10 NOTE — Patient Instructions (Addendum)
1. Moderate persistent asthma, uncomplicated - Lung testing looks great today. - We are not going to make any medication changes at this point.  - Daily controller medication(s): Symbicort 163mg two puffs twice daily with spacer - Prior to physical activity: ProAir 2 puffs 10-15 minutes before physical activity. - Rescue medications: ProAir 4 puffs every 4-6 hours as needed - Changes during respiratory infections or worsening symptoms: Add on Flovent 1174m to 4 puffs twice daily for TWO WEEKS. - Asthma control goals:  * Full participation in all desired activities (may need albuterol before activity) * Albuterol use two time or less a week on average (not counting use with activity) * Cough interfering with sleep two time or less a month * Oral steroids no more than once a year * No hospitalizations  2. Seasonal and perennial allergic rhinitis (grasses, weeds, trees, molds, dust mites, cat, dog) - Continue with Allegra daily.  - Continue with fluticasone nasal spray one spray per nostril daily. - We can always restart shots in the future if needed.   3. Eczema - Continue with moisturizer twice daily.  - Continue with Elocon as needed twice daily.   4. Return in about 6 months (around 03/13/2023).    Please inform usKoreaf any Emergency Department visits, hospitalizations, or changes in symptoms. Call usKoreaefore going to the ED for breathing or allergy symptoms since we might be able to fit you in for a sick visit. Feel free to contact usKoreanytime with any questions, problems, or concerns.  It was a pleasure to see you again today! HOW did you daughter grow up so fast!   Websites that have reliable patient information: 1. American Academy of Asthma, Allergy, and Immunology: www.aaaai.org 2. Food Allergy Research and Education (FARE): foodallergy.org 3. Mothers of Asthmatics: http://www.asthmacommunitynetwork.org 4. American College of Allergy, Asthma, and Immunology:  www.acaai.org   COVID-19 Vaccine Information can be found at: [email or web address] questions related to vaccine distribution or appointments, please email [email or web address] or call [phone]  We realize that you might be concerned about having an allergic reaction to the COVID19 vaccines. To help with that concern, WE ARE OFFERING THE COVID19 VACCINES IN OUR OFFICE! Ask the front desk for dates!     "Like" usKorean Facebook and Instagram for our latest updates!      A healthy democracy works best when ALNew York Life Insurancearticipate! Make sure you are registered to vote! If you have moved or changed any of your contact information, you will need to get this updated before voting!  In some cases, you MAY be able to register to vote online: [email or web address]

## 2022-09-11 ENCOUNTER — Encounter: Payer: Self-pay | Admitting: Allergy & Immunology

## 2022-09-13 ENCOUNTER — Other Ambulatory Visit (HOSPITAL_COMMUNITY): Payer: Self-pay

## 2022-10-04 DIAGNOSIS — Z419 Encounter for procedure for purposes other than remedying health state, unspecified: Secondary | ICD-10-CM | POA: Diagnosis not present

## 2022-10-26 DIAGNOSIS — Z20822 Contact with and (suspected) exposure to covid-19: Secondary | ICD-10-CM | POA: Diagnosis not present

## 2022-10-26 DIAGNOSIS — J00 Acute nasopharyngitis [common cold]: Secondary | ICD-10-CM | POA: Diagnosis not present

## 2022-10-26 DIAGNOSIS — M791 Myalgia, unspecified site: Secondary | ICD-10-CM | POA: Diagnosis not present

## 2022-11-03 DIAGNOSIS — Z419 Encounter for procedure for purposes other than remedying health state, unspecified: Secondary | ICD-10-CM | POA: Diagnosis not present

## 2022-12-10 DIAGNOSIS — F411 Generalized anxiety disorder: Secondary | ICD-10-CM | POA: Diagnosis not present

## 2022-12-10 DIAGNOSIS — F320 Major depressive disorder, single episode, mild: Secondary | ICD-10-CM | POA: Diagnosis not present

## 2023-01-03 DIAGNOSIS — Z419 Encounter for procedure for purposes other than remedying health state, unspecified: Secondary | ICD-10-CM | POA: Diagnosis not present

## 2023-02-03 DIAGNOSIS — Z419 Encounter for procedure for purposes other than remedying health state, unspecified: Secondary | ICD-10-CM | POA: Diagnosis not present

## 2023-02-08 ENCOUNTER — Ambulatory Visit (INDEPENDENT_AMBULATORY_CARE_PROVIDER_SITE_OTHER): Payer: Medicaid Other | Admitting: Podiatry

## 2023-02-08 DIAGNOSIS — B351 Tinea unguium: Secondary | ICD-10-CM | POA: Diagnosis not present

## 2023-02-08 DIAGNOSIS — Z79899 Other long term (current) drug therapy: Secondary | ICD-10-CM | POA: Diagnosis not present

## 2023-02-08 NOTE — Progress Notes (Signed)
Subjective:   Patient ID: Zoe House, female   DOB: 22 y.o.   MRN: [id number]   HPI No chief complaint on file.   22 year old female presents with concerns of fungus to her nails.  Started about 6 months ago. No treatment. No drinage.  They do not cause pain.  No other concerns.   Review of Systems  All other systems reviewed and are negative.  Past Medical History:  Diagnosis Date   Asthma    Eczema     Past Surgical History:  Procedure Laterality Date   ADENOIDECTOMY     TONSILLECTOMY     tubes in ears     TYMPANOSTOMY TUBE PLACEMENT       Current Outpatient Medications:    albuterol (PROVENTIL) (2.5 MG/3ML) 0.083% nebulizer solution, Take 3 mLs (2.5 mg total) by nebulization every 4 (four) hours as needed for wheezing or shortness of breath., Disp: 75 mL, Rfl: 1   albuterol (VENTOLIN HFA) 108 (90 Base) MCG/ACT inhaler, Inhale 2 puffs into the lungs every 6 (six) hours as needed for wheezing or shortness of breath., Disp: 18 g, Rfl: 2   budesonide-formoterol (SYMBICORT) 160-4.5 MCG/ACT inhaler, Inhale 2 puffs into the lungs in the morning and at bedtime., Disp: 1 each, Rfl: 5   buPROPion (WELLBUTRIN XL) 150 MG 24 hr tablet, Take 150 mg by mouth daily., Disp: , Rfl:    fexofenadine (ALLEGRA) 180 MG tablet, Take 1 tablet (180 mg total) by mouth daily., Disp: 30 tablet, Rfl: 5   mometasone (ELOCON) 0.1 % cream, APPLY EXTERNALLY TO THE AFFECTED AREA TWICE DAILY AS NEEDED FOR ECZEMA, Disp: 45 g, Rfl: 5   Norethindrone-Ethinyl Estradiol-Fe Biphas (LO LOESTRIN FE) 1 MG-10 MCG / 10 MCG tablet, Take 1 tablet by mouth daily., Disp: 84 tablet, Rfl: 4   terbinafine (LAMISIL) 250 MG tablet, Take 1 tablet (250 mg total) by mouth daily., Disp: 90 tablet, Rfl: 0  Allergies  Allergen Reactions   Other Other (See Comments)    Pt states that she is allergic to trees, dogs, cats, mold, grass, dust, and 52 other outside sources.  Pt states that she is NOT allergic to any kind of medication  or food.            Objective:  Physical Exam  General: AAO x3, NAD  Dermatological: Nails are hypertrophic, dystrophic with subungual debris present.  There is yellow, brown discoloration of the toenails.  No edema, erythema.  Dry, flaky skin present.  There is well.  No open lesions.  Vascular: Dorsalis Pedis artery and Posterior Tibial artery pedal pulses are 2/4 bilateral with immedate capillary fill time.  There is no pain with calf compression, swelling, warmth, erythema.   Neruologic: Grossly intact via light touch bilateral.   Musculoskeletal: No pain on exam.      Assessment:   Onychomycosis     Plan:  -Treatment options discussed including all alternatives, risks, and complications -Etiology of symptoms were discussed -Discussed treatment options for nail fungus including oral, topical as well as alternative treatments.  At this time the patient wants to proceed with oral Lamisil.  We discussed side effects of the medication and success rates.  We will check a CBC and LFT prior to starting the medication.  Once I receive the results of this I will then call the medication in.   Vivi Barrack DPM

## 2023-02-08 NOTE — Patient Instructions (Signed)
Terbinafine Tablets What is this medication? TERBINAFINE (TER bin a feen) treats fungal infections of the nails. It belongs to a group of medications called antifungals. It will not treat infections caused by bacteria or viruses. This medicine may be used for other purposes; ask your health care provider or pharmacist if you have questions. COMMON BRAND NAME(S): Lamisil, Terbinex What should I tell my care team before I take this medication? They need to know if you have any of these conditions: Liver disease An unusual or allergic reaction to terbinafine, other medications, foods, dyes, or preservatives Pregnant or trying to get pregnant Breast-feeding How should I use this medication? Take this medication by mouth with water. Take it as directed on the prescription label at the same time every day. You can take it with or without food. If it upsets your stomach, take it with food. Keep taking it unless your care team tells you to stop. A special MedGuide will be given to you by the pharmacist with each prescription and refill. Be sure to read this information carefully each time. Talk to your care team regarding the use of this medication in children. Special care may be needed. Overdosage: If you think you have taken too much of this medicine contact a poison control center or emergency room at once. NOTE: This medicine is only for you. Do not share this medicine with others. What if I miss a dose? If you miss a dose, take it as soon as you can unless it is more than 4 hours late. If it is more than 4 hours late, skip the missed dose. Take the next dose at the normal time. What may interact with this medication? Do not take this medication with any of the following: Pimozide Thioridazine This medication may also interact with the following: Beta blockers Caffeine Certain medications for mental health conditions Cimetidine Cyclosporine Medications for fungal infections like fluconazole  and ketoconazole Medications for irregular heartbeat like amiodarone, flecainide and propafenone Rifampin Warfarin This list may not describe all possible interactions. Give your health care provider a list of all the medicines, herbs, non-prescription drugs, or dietary supplements you use. Also tell them if you smoke, drink alcohol, or use illegal drugs. Some items may interact with your medicine. What should I watch for while using this medication? Visit your care team for regular checks on your progress. You may need blood work while you are taking this medication. It may be some time before you see the benefit from this medication. This medication may cause serious skin reactions. They can happen weeks to months after starting the medication. Contact your care team right away if you notice fevers or flu-like symptoms with a rash. The rash may be red or purple and then turn into blisters or peeling of the skin. Or, you might notice a red rash with swelling of the face, lips or lymph nodes in your neck or under your arms. This medication can make you more sensitive to the sun. Keep out of the sun, If you cannot avoid being in the sun, wear protective clothing and sunscreen. Do not use sun lamps or tanning beds/booths. What side effects may I notice from receiving this medication? Side effects that you should report to your care team as soon as possible: Allergic reactions--skin rash, itching, hives, swelling of the face, lips, tongue, or throat Change in sense of smell Change in taste Infection--fever, chills, cough, or sore throat Liver injury--right upper belly pain, loss of appetite, nausea,   light-colored stool, dark yellow or brown urine, yellowing skin or eyes, unusual weakness or fatigue Low red blood cell level--unusual weakness or fatigue, dizziness, headache, trouble breathing Lupus-like syndrome--joint pain, swelling, or stiffness, butterfly-shaped rash on the face, rashes that get worse  in the sun, fever, unusual weakness or fatigue Rash, fever, and swollen lymph nodes Redness, blistering, peeling, or loosening of the skin, including inside the mouth Unusual bruising or bleeding Worsening mood, feelings of depression Side effects that usually do not require medical attention (report to your care team if they continue or are bothersome): Diarrhea Gas Headache Nausea Stomach pain Upset stomach This list may not describe all possible side effects. Call your doctor for medical advice about side effects. You may report side effects to FDA at 1-800-FDA-1088. Where should I keep my medication? Keep out of the reach of children and pets. Store between 20 and 25 degrees C (68 and 77 degrees F). Protect from light. Get rid of any unused medication after the expiration date. To get rid of medications that are no longer needed or have expired: Take the medication to a medication take-back program. Check with your pharmacy or law enforcement to find a location. If you cannot return the medication, check the label or package insert to see if the medication should be thrown out in the garbage or flushed down the toilet. If you are not sure, ask your care team. If it is safe to put it in the trash, take the medication out of the container. Mix the medication with cat litter, dirt, coffee grounds, or other unwanted substance. Seal the mixture in a bag or container. Put it in the trash. NOTE: This sheet is a summary. It may not cover all possible information. If you have questions about this medicine, talk to your doctor, pharmacist, or health care provider.  2024 Elsevier/Gold Standard (2021-02-04 00:00:00)  

## 2023-02-10 ENCOUNTER — Other Ambulatory Visit: Payer: Self-pay | Admitting: Podiatry

## 2023-02-10 DIAGNOSIS — Z79899 Other long term (current) drug therapy: Secondary | ICD-10-CM

## 2023-02-10 MED ORDER — TERBINAFINE HCL 250 MG PO TABS: 250.0000 mg | ORAL_TABLET | Freq: Every day | ORAL | 0 refills | Status: DC

## 2023-03-06 DIAGNOSIS — Z419 Encounter for procedure for purposes other than remedying health state, unspecified: Secondary | ICD-10-CM | POA: Diagnosis not present

## 2023-03-16 ENCOUNTER — Ambulatory Visit: Payer: Medicaid Other | Admitting: Allergy & Immunology

## 2023-03-21 ENCOUNTER — Ambulatory Visit (INDEPENDENT_AMBULATORY_CARE_PROVIDER_SITE_OTHER): Payer: Medicaid Other | Admitting: Internal Medicine

## 2023-03-21 ENCOUNTER — Encounter: Payer: Self-pay | Admitting: Internal Medicine

## 2023-03-21 VITALS — BP 125/72 | HR 89 | Temp 97.8°F | Resp 20 | Ht 63.11 in

## 2023-03-21 DIAGNOSIS — J4540 Moderate persistent asthma, uncomplicated: Secondary | ICD-10-CM | POA: Diagnosis not present

## 2023-03-21 DIAGNOSIS — J302 Other seasonal allergic rhinitis: Secondary | ICD-10-CM | POA: Diagnosis not present

## 2023-03-21 DIAGNOSIS — L2089 Other atopic dermatitis: Secondary | ICD-10-CM | POA: Diagnosis not present

## 2023-03-21 DIAGNOSIS — J3089 Other allergic rhinitis: Secondary | ICD-10-CM | POA: Diagnosis not present

## 2023-03-21 MED ORDER — FLUTICASONE PROPIONATE 50 MCG/ACT NA SUSP: 2.0000 | Freq: Every day | NASAL | 5 refills | Status: DC

## 2023-03-21 MED ORDER — HYDROCORTISONE 2.5 % EX CREA: TOPICAL_CREAM | CUTANEOUS | 3 refills | Status: DC

## 2023-03-21 MED ORDER — BUDESONIDE-FORMOTEROL FUMARATE 160-4.5 MCG/ACT IN AERO: 2.0000 | INHALATION_SPRAY | Freq: Two times a day (BID) | RESPIRATORY_TRACT | 5 refills | Status: DC

## 2023-03-21 MED ORDER — MOMETASONE FUROATE 0.1 % EX CREA: TOPICAL_CREAM | CUTANEOUS | 3 refills | Status: DC

## 2023-03-21 MED ORDER — FEXOFENADINE HCL 180 MG PO TABS: 180.0000 mg | ORAL_TABLET | Freq: Every day | ORAL | 5 refills | Status: DC

## 2023-03-21 MED ORDER — ALBUTEROL SULFATE HFA 108 (90 BASE) MCG/ACT IN AERS: 2.0000 | INHALATION_SPRAY | Freq: Four times a day (QID) | RESPIRATORY_TRACT | 1 refills | Status: DC | PRN

## 2023-03-21 NOTE — Patient Instructions (Addendum)
1. Moderate persistent asthma, uncomplicated - Daily controller medication(s): Symbicort two puffs once daily with spacer. If symptoms worsen, increase to twice daily.  - Prior to physical activity, if needed: Albuterol 1-2 puffs, 10-15 minutes before physical activity. - Rescue medications: Albuterol 1-2 puffs every 4-6 hours as needed for wheezing/shortness of breath.  - Asthma control goals:  * Full participation in all desired activities (may need albuterol before activity) * Albuterol use two time or less a week on average (not counting use with activity) * Cough interfering with sleep two time or less a month * Oral steroids no more than once a year * No hospitalizations  2. Seasonal and perennial allergic rhinitis (grasses, weeds, trees, molds, dust mites, cat, dog) - Avoidance measures discussed. - Use nasal saline rinses before nose sprays such as with Neilmed Sinus Rinse.  Use distilled water.   - Use Flonase 2 sprays each nostril daily. Aim upward and outward. - Use Allegra 180 mg daily.  - Consider allergy shots as long term control of your symptoms by teaching your immune system to be more tolerant of your allergy triggers  3. Eczema - Do a daily soaking tub bath in warm water for 10-15 minutes.  - Use a gentle, unscented cleanser at the end of the bath (such as Dove unscented bar or baby wash, or Aveeno sensitive body wash). Then rinse, pat half-way dry, and apply a gentle, unscented moisturizer cream or ointment (Cerave, Cetaphil, Eucerin, Aveeno)  all over while still damp. Dry skin makes the itching and rash of eczema worse. The skin should be moisturized with a gentle, unscented moisturizer at least twice daily.  - Use only unscented liquid laundry detergent. - Apply prescribed topical steroid (mometasone 0.1% below neck or hydrocortisone 2.5% above neck) to flared areas (red and thickened eczema) after the moisturizer has soaked into the skin (wait at least 30  minutes). Taper off the topical steroids as the skin improves. Do not use topical steroid for more than 7-10 days at a time.

## 2023-03-21 NOTE — Progress Notes (Signed)
FOLLOW UP Date of Service/Encounter:  03/21/23   Subjective:  Zoe House (DOB: [birth year]) is a 22 y.o. female who [facility] for follow up for asthma, allergic rhinitis and eczema.   History obtained from: chart review and patient. Last visit was on 09/10/2022 with Dr Dellis Anes and at the time was doing well on Symbicort, Flonase, Allegra, Mometasone topical PRN.   Asthma Control Test: ACT Total Score: 22.    Since last visit, reports her asthma is doing well.  She has not had much issues with breathing/coughing.  No ER visits/oral prednisone/nighttime sxs since last visit.  Using Symbicort daily.  Rarely needs albuterol.  Allergies have been uncontrolled recently.  Went on a vacation to [place] and now coming back it is colder.  Has had increased runny nose and drainage.  Also ran out of her allergy meds on vacation.  Restarted Flonase and Allegra.   Eczema is overall doing well.  No outbreaks recently but does require PRN mometasone topical. Using lotion to moisturize.     Past Medical History: Past Medical History:  Diagnosis Date   Asthma    Eczema     Objective:  BP 125/72   Pulse 89   Temp 97.8 F (36.6 C)   Resp 20   Ht 5' 3.11" (1.603 m)   SpO2 99%   BMI 27.54 kg/m  Body mass index is 27.54 kg/m. Physical Exam: GEN: alert, well developed HEENT: clear conjunctiva, TM grey and translucent, nose with mild inferior turbinate hypertrophy, pink nasal mucosa, clear rhinorrhea, no cobblestoning HEART: regular rate and rhythm, no murmur LUNGS: clear to auscultation bilaterally, no coughing, unlabored respiration SKIN: no rashes or lesions  Spirometry:  Tracings reviewed. Her effort: Good reproducible efforts. FVC: 3.26L FEV1: 2.63L, 85% predicted FEV1/FVC ratio: 81% Interpretation: Spirometry consistent with normal pattern.  Please see scanned spirometry results for details.  Assessment:   1. Moderate persistent  asthma, uncomplicated   2. Seasonal and perennial allergic rhinitis   3. Flexural atopic dermatitis     Plan/Recommendations:  1. Moderate persistent asthma, uncomplicated - Well controlled on daily ICS/LABA with normal spirometry.  MDI teaching performed.   - Daily controller medication(s): Symbicort two puffs once daily with spacer. If symptoms worsen, increase to twice daily.  - Prior to physical activity, if needed: Albuterol 1-2 puffs, 10-15 minutes before physical activity. - Rescue medications: Albuterol 1-2 puffs every 4-6 hours as needed for wheezing/shortness of breath.  - Asthma control goals:  * Full participation in all desired activities (may need albuterol before activity) * Albuterol use two time or less a week on average (not counting use with activity) * Cough interfering with sleep two time or less a month * Oral steroids no more than once a year * No hospitalizations  2. Seasonal and perennial allergic rhinitis (grasses, weeds, trees, molds, dust mites, cat, dog) - Uncontrolled recently due to vacation and running out of medications. Discussed restarting meds.  - Avoidance measures discussed. - Use nasal saline rinses before nose sprays such as with Neilmed Sinus Rinse.  Use distilled water.   - Use Flonase 2 sprays each nostril daily. Aim upward and outward. - Use Allegra 180 mg daily.  - Consider allergy shots as long term control of your symptoms by teaching your immune system to be more tolerant of your allergy triggers  3. Eczema - Well controlled on topical steroids PRN.  - Do a daily soaking tub  bath in warm water for 10-15 minutes.  - Use a gentle, unscented cleanser at the end of the bath (such as Dove unscented bar or baby wash, or Aveeno sensitive body wash). Then rinse, pat half-way dry, and apply a gentle, unscented moisturizer cream or ointment (Cerave, Cetaphil, Eucerin, Aveeno)  all over while still damp. Dry skin makes the itching and rash of  eczema worse. The skin should be moisturized with a gentle, unscented moisturizer at least twice daily.  - Use only unscented liquid laundry detergent. - Apply prescribed topical steroid (mometasone 0.1% below neck or hydrocortisone 2.5% above neck) to flared areas (red and thickened eczema) after the moisturizer has soaked into the skin (wait at least 30 minutes). Taper off the topical steroids as the skin improves. Do not use topical steroid for more than 7-10 days at a time.       Return in about 6 months (around 09/18/2023).  Alesia Morin, MD [facility]

## 2023-04-05 DIAGNOSIS — Z419 Encounter for procedure for purposes other than remedying health state, unspecified: Secondary | ICD-10-CM | POA: Diagnosis not present

## 2023-04-11 ENCOUNTER — Other Ambulatory Visit (HOSPITAL_COMMUNITY)
Admission: RE | Admit: 2023-04-11 | Discharge: 2023-04-11 | Disposition: A | Discharge status: Home / Self Care | Payer: Medicaid Other | Source: Ambulatory Visit | Attending: Obstetrics & Gynecology | Admitting: Obstetrics & Gynecology

## 2023-04-11 ENCOUNTER — Other Ambulatory Visit (INDEPENDENT_AMBULATORY_CARE_PROVIDER_SITE_OTHER): Payer: Medicaid Other

## 2023-04-11 VITALS — BP 112/76 | Ht 62.0 in | Wt 144.0 lb

## 2023-04-11 DIAGNOSIS — Z3202 Encounter for pregnancy test, result negative: Secondary | ICD-10-CM | POA: Diagnosis not present

## 2023-04-11 DIAGNOSIS — N898 Other specified noninflammatory disorders of vagina: Secondary | ICD-10-CM

## 2023-04-11 LAB — POCT URINE PREGNANCY: Preg Test, Ur: NEGATIVE

## 2023-04-11 NOTE — Progress Notes (Signed)
   NURSE VISIT- PREGNANCY CONFIRMATION   SUBJECTIVE:  Zoe House is a 22 y.o. [id number] female at Unknown by uncertain LMP of No LMP recorded (lmp unknown). (Menstrual status: Oral contraceptives). Here for pregnancy confirmation.  Home pregnancy test: negative  She reports spotty light bleeding and clear stringy vaginal discharge for two months. She states she has not missed any of her oral contraceptive pills. She is not taking prenatal vitamins. Patient requests one pack of her Lo Loestrin pills because she cannot find hers after returning from vacation and needs to start them tonight. It is too soon to get refill from pharmacy.   OBJECTIVE:  BP 112/76 (BP Location: Right Arm, Patient Position: Sitting, Cuff Size: Normal)   Ht 5\' 2"  (1.575 m)   Wt 144 lb (65.3 kg)   LMP  (LMP Unknown) Comment: light spotting for two months  BMI 26.34 kg/m   Appears well, in no apparent distress  Results for orders placed or performed in visit on 04/11/23 (from the past 24 hour(s))  POCT urine pregnancy   Collection Time: 04/11/23  2:33 PM  Result Value Ref Range   Preg Test, Ur Negative Negative    ASSESSMENT: Negative pregnancy test    PLAN: Prenatal vitamins:  Not needed.    Nausea medicines: not currently needed   OB packet given: No Self swab collected and sent to lab to test for GC, chlamydia, trich, BV, and yeast.  Patient to follow up with provider if symptoms do not resolve or worsen.  One pack of Lo Loestrin oral contraceptive pills given to patient.   Caralyn Guile  04/11/2023 2:35 PM

## 2023-04-12 ENCOUNTER — Other Ambulatory Visit: Payer: Medicaid Other

## 2023-04-13 ENCOUNTER — Other Ambulatory Visit: Payer: Self-pay | Admitting: Obstetrics & Gynecology

## 2023-04-13 LAB — CERVICOVAGINAL ANCILLARY ONLY
Bacterial Vaginitis (gardnerella): POSITIVE — AB
Candida Glabrata: NEGATIVE
Candida Vaginitis: NEGATIVE
Chlamydia: NEGATIVE
Comment: NEGATIVE
Comment: NEGATIVE
Comment: NEGATIVE
Comment: NEGATIVE
Comment: NEGATIVE
Comment: NORMAL
Neisseria Gonorrhea: NEGATIVE
Trichomonas: POSITIVE — AB

## 2023-04-13 MED ORDER — METRONIDAZOLE 500 MG PO TABS: 500.0000 mg | ORAL_TABLET | Freq: Two times a day (BID) | ORAL | 0 refills | Status: DC

## 2023-05-06 DIAGNOSIS — Z419 Encounter for procedure for purposes other than remedying health state, unspecified: Secondary | ICD-10-CM | POA: Diagnosis not present

## 2023-05-08 ENCOUNTER — Other Ambulatory Visit: Payer: Self-pay | Admitting: Adult Health

## 2023-05-08 DIAGNOSIS — Z3009 Encounter for other general counseling and advice on contraception: Secondary | ICD-10-CM

## 2023-05-09 ENCOUNTER — Telehealth: Payer: Self-pay

## 2023-05-09 NOTE — Telephone Encounter (Signed)
Pt called and stated that it is to early to get her birth control rx filled. Pt wants to know if she can have a sample pack of the LO LO.

## 2023-05-09 NOTE — Telephone Encounter (Signed)
Pt called requesting a sample pack of Lo Loestrin. 1 sample pack given. Lot # [id number] A exp 06/2024. JSY

## 2023-05-26 ENCOUNTER — Ambulatory Visit (INDEPENDENT_AMBULATORY_CARE_PROVIDER_SITE_OTHER): Payer: Medicaid Other | Admitting: Podiatry

## 2023-05-26 ENCOUNTER — Encounter: Payer: Self-pay | Admitting: Podiatry

## 2023-05-26 DIAGNOSIS — B351 Tinea unguium: Secondary | ICD-10-CM

## 2023-05-26 DIAGNOSIS — L600 Ingrowing nail: Secondary | ICD-10-CM | POA: Diagnosis not present

## 2023-05-26 MED ORDER — TERBINAFINE HCL 250 MG PO TABS: 250.0000 mg | ORAL_TABLET | Freq: Every day | ORAL | 3 refills | Status: AC

## 2023-05-26 NOTE — Progress Notes (Signed)
Chief Complaint  Patient presents with   Ingrown Toenail    Left big toe has in grown nail.  Rt big toe, needs refill on lamisil.  Nail on toe is not growing. Should it be removed.    HPI: 22 y.o. female presents today for follow-up evaluation of fungal right great toenail.  She also has primary chief complaint of ingrown toenail to left great toe and has been bothering her for several weeks.  She would like this removed today.  She denies any drainage to the area but it is tender and inflamed.  Regarding the fungal toenail, she has definitely taken 1 month of the oral terbinafine.  She would like to discuss removal of this going forward however she wants this done on separate days due to needing to work  Past Medical History:  Diagnosis Date   Asthma    Eczema     Past Surgical History:  Procedure Laterality Date   ADENOIDECTOMY     TONSILLECTOMY     tubes in ears     TYMPANOSTOMY TUBE PLACEMENT      Allergies  Allergen Reactions   Other Other (See Comments)    Pt states that she is allergic to trees, dogs, cats, mold, grass, dust, and 52 other outside sources.  Pt states that she is NOT allergic to any kind of medication or food.      ROS    Physical Exam: There were no vitals filed for this visit.  General: The patient is alert and oriented x3 in no acute distress.  Dermatology: Skin is warm, dry and supple bilateral lower extremities. Interspaces are clear of maceration and debris.  Left hallux medial border is tender on palpation, mild edema evaluation present. No erythema, no drainage.  Left hallux great toenail notable for increased thickness, discoloration with subungual debris, dystrophic changes suggestive for mycotic infection.  Vascular: Palpable pedal pulses bilaterally. Capillary refill within normal limits.  No appreciable edema.  No erythema or calor.  Neurological: Light touch sensation grossly intact bilateral feet.   Musculoskeletal Exam: No  pedal deformities noted  Radiographic Exam:  Normal osseous mineralization. Joint spaces preserved.  No fractures or osseous irregularities noted.  Assessment/Plan of Care: 1. Onychomycosis   2. Ingrown toenail of left foot      Meds ordered this encounter  Medications   terbinafine (LAMISIL) 250 MG tablet    Sig: Take 1 tablet (250 mg total) by mouth daily.    Dispense:  30 tablet    Refill:  3   None  Discussed clinical findings with patient today.  Plan -Discussed ingrown toenail procedure for the left hallux medial border as well as removal of the right great toenail.  Patient prefers to do 1 at a time.  We proceeded with partial nail avulsion left hallux medial with chemical matrixectomy today after discussion of risk, benefits and alternative therapies.  Written consent obtained. -Left hallux was anesthetized injecting a one-to-one ratio of 2% lidocaine plain and 0.5% Marcaine plain left great toe, total of 3 cc. -The toe was prepped with Betadine solution.  Tourniquet was applied to the left great toe.  The left hallux medial border was freed using a Freer and nail splitter.  The medial border was then resected using a nail clipper proximal to the eponychium.  Hemostat was used to remove the nail.  The site was inspected for any remaining spicules.  Chemical matrixectomy was then performed using 3 applications  of 8 seconds each of sodium hydroxide 10%.  Silvadene nonadherent bandage with Coban was then applied.  Patient tolerated procedure well. -In depth discussion of aftercare instructions and wound care, soaking instructions was discussed with patient.  Written instructions were provided. -Regarding the right hallux, we will start patient on oral terbinafine for now.  She would like to proceed with removal of the toenail at a later date, this can be done in 2 weeks at her follow-up.  Will keep her on terbinafine to clear out the fungal skin infection underneath the nail  plate. -Previous labs reviewed, no elevation of liver enzymes appreciated.   Efren Kross L. Marchia Bond, AACFAS [facility]     [address] [place], [place] [address]                Office [phone]  Fax [phone]

## 2023-05-26 NOTE — Patient Instructions (Signed)

## 2023-06-05 DIAGNOSIS — Z419 Encounter for procedure for purposes other than remedying health state, unspecified: Secondary | ICD-10-CM | POA: Diagnosis not present

## 2023-07-06 DIAGNOSIS — Z419 Encounter for procedure for purposes other than remedying health state, unspecified: Secondary | ICD-10-CM | POA: Diagnosis not present

## 2023-07-21 ENCOUNTER — Ambulatory Visit: Payer: Medicaid Other | Admitting: Adult Health

## 2023-08-06 DIAGNOSIS — Z419 Encounter for procedure for purposes other than remedying health state, unspecified: Secondary | ICD-10-CM | POA: Diagnosis not present

## 2023-08-18 ENCOUNTER — Ambulatory Visit: Payer: Medicaid Other | Admitting: Podiatry

## 2023-08-23 ENCOUNTER — Ambulatory Visit: Payer: Medicaid Other | Admitting: Adult Health

## 2023-08-23 ENCOUNTER — Encounter: Payer: Self-pay | Admitting: Adult Health

## 2023-08-23 VITALS — BP 121/82 | HR 92 | Ht 62.0 in | Wt 140.5 lb

## 2023-08-23 DIAGNOSIS — Z3041 Encounter for surveillance of contraceptive pills: Secondary | ICD-10-CM

## 2023-08-23 DIAGNOSIS — Z01419 Encounter for gynecological examination (general) (routine) without abnormal findings: Secondary | ICD-10-CM

## 2023-08-23 DIAGNOSIS — Z1331 Encounter for screening for depression: Secondary | ICD-10-CM | POA: Diagnosis not present

## 2023-08-23 DIAGNOSIS — Z3009 Encounter for other general counseling and advice on contraception: Secondary | ICD-10-CM

## 2023-08-23 MED ORDER — LO LOESTRIN FE 1 MG-10 MCG / 10 MCG PO TABS: 1.0000 | ORAL_TABLET | Freq: Every day | ORAL | 4 refills | Status: AC

## 2023-08-23 NOTE — Progress Notes (Signed)
Patient ID: Zoe House, female   DOB: 2000-11-12, 23 y.o.   MRN: [id number] History of Present Illness: Zoe House is a 23 year old white female, with SO, G1P1001, in for a well woman gyn exam and refill  lo Loestrin, she is happy with the pills.     Component Value Date/Time   DIAGPAP  07/19/2022 1500    - Negative for intraepithelial lesion or malignancy (NILM)   DIAGPAP  04/30/2020 1543    - Negative for intraepithelial lesion or malignancy (NILM)   ADEQPAP  07/19/2022 1500    Satisfactory for evaluation; transformation zone component ABSENT.   ADEQPAP  04/30/2020 1543    Satisfactory for evaluation; transformation zone component PRESENT.      Current Medications, Allergies, Past Medical History, Past Surgical History, Family History and Social History were reviewed in [employer] record.     Review of Systems: Patient denies any headaches, hearing loss, fatigue, blurred vision, shortness of breath, chest pain, abdominal pain, problems with bowel movements, urination, or intercourse. No joint pain or mood swings.     Physical Exam:BP 121/82 (BP Location: Left Arm, Patient Position: Sitting, Cuff Size: Normal)   Pulse 92   Ht 5\' 2"  (1.575 m)   Wt 140 lb 8 oz (63.7 kg)   BMI 25.70 kg/m   General:  Well developed, well nourished, no acute distress Skin:  Warm and dry Neck:  Midline trachea, normal thyroid, good ROM, no lymphadenopathy Lungs; Clear to auscultation bilaterally Breast:  No dominant palpable mass, retraction, or nipple discharge Cardiovascular: Regular rate and rhythm Abdomen:  Soft, non tender, no hepatosplenomegaly Pelvic:  External genitalia is normal in appearance, no lesions.  The vagina is normal in appearance. Urethra has no lesions or masses. The cervix is bulbous.  Uterus is felt to be normal size, shape, and contour.  No adnexal masses or tenderness noted.Bladder is non tender, no masses felt. Extremities/musculoskeletal:  No swelling  or varicosities noted, no clubbing or cyanosis Psych:  No mood changes, alert and cooperative,seems happy AA is 2 Fall risk is low    08/23/2023    1:41 PM 07/19/2022    2:53 PM 07/03/2021    9:23 AM  Depression screen PHQ 2/9  Decreased Interest 1 0 1  Down, Depressed, Hopeless 1 0 1  PHQ - 2 Score 2 0 2  Altered sleeping 1 0 0  Tired, decreased energy 3 3 3   Change in appetite 0 1 0  Feeling bad or failure about yourself  0 0 0  Trouble concentrating 0 0 0  Moving slowly or fidgety/restless 1 1 0  Suicidal thoughts 0 0 0  PHQ-9 Score 7 5 5    On meds and sees [facility] in [place]    08/23/2023    1:42 PM 07/19/2022    2:56 PM 07/03/2021    9:23 AM 04/30/2020    3:54 PM  GAD 7 : Generalized Anxiety Score  Nervous, Anxious, on Edge 1 3 1 2   Control/stop worrying 1 3 1 2   Worry too much - different things 1 3 1 2   Trouble relaxing 1 1 1 1   Restless 2 0 0 0  Easily annoyed or irritable 3 3 1 1   Afraid - awful might happen 0 0 0 0  Total GAD 7 Score 9 13 5 8       Upstream - 08/23/23 1341       Pregnancy Intention Screening   Does the patient want  to become pregnant in the next year? Ok Either Way    Does the patient's partner want to become pregnant in the next year? Ok Either Way    Would the patient like to discuss contraceptive options today? No      Contraception Wrap Up   Current Method Oral Contraceptive    End Method Oral Contraceptive    Contraception Counseling Provided Yes            Examination chaperoned by Malachy Mood LPN   Impression and Plan: 1. Encounter for well woman exam with routine gynecological exam (Primary) Physical in 1 year Pap in 2027  2. Encounter for surveillance of contraceptive pills Refilled lo Loestrin   3. Family planning - Norethindrone-Ethinyl Estradiol-Fe Biphas (LO LOESTRIN FE) 1 MG-10 MCG / 10 MCG tablet; Take 1 tablet by mouth daily.  Dispense: 84 tablet; Refill: 4

## 2023-09-03 DIAGNOSIS — Z419 Encounter for procedure for purposes other than remedying health state, unspecified: Secondary | ICD-10-CM | POA: Diagnosis not present

## 2023-09-09 ENCOUNTER — Encounter: Payer: Self-pay | Admitting: Podiatry

## 2023-09-09 ENCOUNTER — Ambulatory Visit (INDEPENDENT_AMBULATORY_CARE_PROVIDER_SITE_OTHER): Payer: Medicaid Other | Admitting: Podiatry

## 2023-09-09 DIAGNOSIS — B351 Tinea unguium: Secondary | ICD-10-CM

## 2023-09-09 NOTE — Progress Notes (Signed)
       Chief Complaint  Patient presents with   Nail Problem    " The right big toe nail may need to be removed"     HPI: 23 y.o. female presents today with mother, she would like to discuss possible removal of the right hallux first toenail.  She has been dealing with nail dystrophy and onychomycosis for some time.  She had been using oral medication without much success.  Previously she had been seen by me for ingrown toenail left first toenail and she is healed well for this.  Past Medical History:  Diagnosis Date   Asthma    Eczema     Past Surgical History:  Procedure Laterality Date   ADENOIDECTOMY     TONSILLECTOMY     tubes in ears     TYMPANOSTOMY TUBE PLACEMENT      Allergies  Allergen Reactions   Other Other (See Comments)    Pt states that she is allergic to trees, dogs, cats, mold, grass, dust, and 52 other outside sources.  Pt states that she is NOT allergic to any kind of medication or food.      ROS    Physical Exam: There were no vitals filed for this visit.  General: The patient is alert and oriented x3 in no acute distress.  Dermatology: Skin is warm, dry and supple bilateral lower extremities. Interspaces are clear of maceration and debris.  Right hallux nail dystrophy with increased thickness, yellow discoloration, longitudinal ridging, tender on direct dorsal palpation.  Left hallux medial border well-healed.  Vascular: Palpable pedal pulses bilaterally. Capillary refill within normal limits.  No appreciable edema.  No erythema or calor.  Neurological: Light touch sensation grossly intact bilateral feet.   Musculoskeletal Exam: No pedal deformities noted  Assessment/Plan of Care: 1. Onychomycosis      No orders of the defined types were placed in this encounter.  None  Discussed clinical findings with patient today.  Discussed that it would be appropriate to proceed with removal of the affected toenail if the patient wishes as she has  not had success with medication.  Did discuss this could be done as a temporary versus permanent procedure.  Explained that since the nail matrix would be violated during the procedure there is a chance that the toenail could grow back with abnormal appearance.  Patient is agreeable to this.  This was offered to her today.  She is deferring the procedure for now until she can make arrangements for work as she would like to be able to take a couple days off due to pain.  Follow-up as needed.   Finnlee Guarnieri L. Marchia Bond, AACFAS [facility]     [address] [place], [place] [address]                Office [phone]  Fax [phone]

## 2023-09-12 ENCOUNTER — Encounter: Payer: Self-pay | Admitting: Podiatry

## 2023-09-13 ENCOUNTER — Telehealth: Payer: Self-pay | Admitting: Podiatry

## 2023-09-13 NOTE — Telephone Encounter (Signed)
 Called the pt at [phone] and advised Dr. Jamse Arn said she would be ok to go back to work 10/17/23 with no restrictions. I advised her I faxed [place] [facility]/notes to [phone]

## 2023-09-19 ENCOUNTER — Encounter: Payer: Self-pay | Admitting: Internal Medicine

## 2023-09-19 ENCOUNTER — Ambulatory Visit (INDEPENDENT_AMBULATORY_CARE_PROVIDER_SITE_OTHER): Payer: Medicaid Other | Admitting: Internal Medicine

## 2023-09-19 VITALS — BP 110/84 | HR 80 | Temp 98.0°F | Resp 16 | Wt 140.5 lb

## 2023-09-19 DIAGNOSIS — J4540 Moderate persistent asthma, uncomplicated: Secondary | ICD-10-CM

## 2023-09-19 DIAGNOSIS — J3089 Other allergic rhinitis: Secondary | ICD-10-CM

## 2023-09-19 DIAGNOSIS — J302 Other seasonal allergic rhinitis: Secondary | ICD-10-CM | POA: Diagnosis not present

## 2023-09-19 DIAGNOSIS — L2089 Other atopic dermatitis: Secondary | ICD-10-CM

## 2023-09-19 MED ORDER — MOMETASONE FUROATE 0.1 % EX CREA: TOPICAL_CREAM | CUTANEOUS | 3 refills | Status: DC

## 2023-09-19 MED ORDER — FLUTICASONE PROPIONATE 50 MCG/ACT NA SUSP: 2.0000 | Freq: Every day | NASAL | 5 refills | Status: AC

## 2023-09-19 MED ORDER — AZELASTINE HCL 0.1 % NA SOLN
2.0000 | Freq: Two times a day (BID) | NASAL | 5 refills | Status: AC | PRN
Start: 2023-09-19 — End: ?

## 2023-09-19 MED ORDER — BUDESONIDE-FORMOTEROL FUMARATE 80-4.5 MCG/ACT IN AERO: INHALATION_SPRAY | RESPIRATORY_TRACT | 1 refills | Status: DC

## 2023-09-19 MED ORDER — ALBUTEROL SULFATE HFA 108 (90 BASE) MCG/ACT IN AERS: 2.0000 | INHALATION_SPRAY | Freq: Four times a day (QID) | RESPIRATORY_TRACT | 1 refills | Status: AC | PRN

## 2023-09-19 NOTE — Progress Notes (Signed)
 FOLLOW UP Date of Service/Encounter:  09/19/23   Subjective:  Zoe House (DOB: October 23, 2000) is a 23 y.o. female [facility] for follow up for asthma, allergic rhinitis, eczema.   History obtained from: chart review and patient. Last seen with me on 03/21/2023  Asthma: doing well on Symbicort, daily with PRN Albuterol Allergies, uncontrolled due to running out of meds. Not interested in AIT at the time, previously did it in 2018-2021 without recheaing maintenance.    Eczema controlled on topical steroids PRN.   Asthma Control Test: ACT Total Score: 23.   Reports asthma is doing well.  Not much trouble with cough/wheeze/dyspnea.  She has self discontinued the Symbicort and doing fine.  Uses Albuterol PRN, maybe recalls a few times of use since last visit. No ER/urgent care visits/oral prednisone use.   Allergies are flared up and generally do so around this time.  Notes frequent congestion, drainage, runny nose.  Using Allegra daily. Not on any nose sprays regularly, sometimes dose Flonase.   Eczema is doing well.  Infrequently requires mometasone for flare ups, a few times in winter.  Generally on arms.  Uses lotions to moisturize with daily.    Past Medical History: Past Medical History:  Diagnosis Date   Asthma    Eczema     Objective:  BP 110/84   Pulse 80   Temp 98 F (36.7 C)   Resp 16   Wt 140 lb 8 oz (63.7 kg)   SpO2 97%   BMI 25.70 kg/m  Body mass index is 25.7 kg/m. Physical Exam: GEN: alert, well developed HEENT: clear conjunctiva, nose with moderate inferior turbinate hypertrophy, pink nasal mucosa, + clear rhinorrhea, no cobblestoning HEART: regular rate and rhythm, no murmur LUNGS: clear to auscultation bilaterally, no coughing, unlabored respiration SKIN: no rashes or lesions  Spirometry:  Tracings reviewed. Her effort: Good reproducible efforts. FVC: 3.26 L, 91% predicted  FEV1: 2.34 L, 76%  predicted FEV1/FVC ratio: 72% Interpretation: Spirometry consistent with normal pattern.  Please see scanned spirometry results for details.  Assessment:   1. Seasonal and perennial allergic rhinitis   2. Flexural atopic dermatitis   3. Moderate persistent asthma, uncomplicated     Plan/Recommendations:  Moderate persistent asthma - Well controlled, normal spirometry. MDI technique discussed.  Okay to use Symbicort PRN as she has self discontinued and doing well.  - With respiratory illness or flare ups, start Symbicort two puffs twice daily with spacer for 1-2 weeks. - Rescue medications: Albuterol 1-2 puffs every 4-6 hours as needed for wheezing/shortness of breath.  - Asthma control goals:  * Full participation in all desired activities (may need albuterol before activity) * Albuterol use two time or less a week on average (not counting use with activity) * Cough interfering with sleep two time or less a month * Oral steroids no more than once a year * No hospitalizations  Seasonal and perennial allergic rhinitis (grasses, weeds, trees, molds, dust mites, cat, dog) - Uncontrolled, discussed restarting Flonase daily and Azelastine PRN.  Has tried AIT in the past but had trouble with reaching maintenance due to missed appointments and not interested in restarting at this time.  Would need retesting if symptoms persist.  - Use nasal saline rinses before nose sprays such as with Neilmed Sinus Rinse.  Use distilled water.   - Use Flonase 2 sprays each nostril daily. Aim upward and outward. - Use Azelastine 2 sprays  each nostril twice daily as needed for congestion, drainage, runny nose, sneezing.  Aim upward and outward.  - Use Allegra 180 mg daily.  - Consider allergy shots as long term control of your symptoms by teaching your immune system to be more tolerant of your allergy triggers  Eczema - Well controlled.  - Do a daily soaking tub bath in warm water for 10-15 minutes.   - Use a gentle, unscented cleanser at the end of the bath (such as Dove unscented bar or baby wash, or Aveeno sensitive body wash). Then rinse, pat half-way dry, and apply a gentle, unscented moisturizer cream or ointment (Cerave, Cetaphil, Eucerin, Aveeno)  all over while still damp. Dry skin makes the itching and rash of eczema worse. The skin should be moisturized with a gentle, unscented moisturizer at least twice daily.  - Use only unscented liquid laundry detergent. - Apply prescribed topical steroid (mometasone 0.1% below neck) to flared areas (red and thickened eczema) after the moisturizer has soaked into the skin (wait at least 30 minutes). Taper off the topical steroids as the skin improves. Do not use topical steroid for more than 7-10 days at a time.      Return in about 6 months (around 03/21/2024).  Alesia Morin, MD [facility]

## 2023-09-19 NOTE — Patient Instructions (Addendum)
 Moderate persistent asthma - Well controlled, normal spirometry. MDI technique discussed.  Okay to use Symbicort PRN.   - With respiratory illness or flare ups, start Symbicort two puffs twice daily with spacer for 1-2 weeks. - Rescue medications: Albuterol 1-2 puffs every 4-6 hours as needed for wheezing/shortness of breath.  - Asthma control goals:  * Full participation in all desired activities (may need albuterol before activity) * Albuterol use two time or less a week on average (not counting use with activity) * Cough interfering with sleep two time or less a month * Oral steroids no more than once a year * No hospitalizations  Seasonal and perennial allergic rhinitis (grasses, weeds, trees, molds, dust mites, cat, dog) - Uncontrolled, discussed restarting Flonase daily and Azelastine PRN.  Has tried AIT in the past but had trouble with reaching maintenance due to missed appointments and not interested in restarting at this time.  Would need retesting if symptoms persist.  - Use nasal saline rinses before nose sprays such as with Neilmed Sinus Rinse.  Use distilled water.   - Use Flonase 2 sprays each nostril daily. Aim upward and outward. - Use Azelastine 2 sprays each nostril twice daily as needed for congestion, drainage, runny nose, sneezing.  Aim upward and outward.  - Use Allegra 180 mg daily.  - Consider allergy shots as long term control of your symptoms by teaching your immune system to be more tolerant of your allergy triggers  Eczema - Well controlled.  - Do a daily soaking tub bath in warm water for 10-15 minutes.  - Use a gentle, unscented cleanser at the end of the bath (such as Dove unscented bar or baby wash, or Aveeno sensitive body wash). Then rinse, pat half-way dry, and apply a gentle, unscented moisturizer cream or ointment (Cerave, Cetaphil, Eucerin, Aveeno)  all over while still damp. Dry skin makes the itching and rash of eczema worse. The skin should be  moisturized with a gentle, unscented moisturizer at least twice daily.  - Use only unscented liquid laundry detergent. - Apply prescribed topical steroid (mometasone 0.1% below neck) to flared areas (red and thickened eczema) after the moisturizer has soaked into the skin (wait at least 30 minutes). Taper off the topical steroids as the skin improves. Do not use topical steroid for more than 7-10 days at a time.

## 2023-10-04 DIAGNOSIS — F320 Major depressive disorder, single episode, mild: Secondary | ICD-10-CM | POA: Diagnosis not present

## 2023-10-04 DIAGNOSIS — F411 Generalized anxiety disorder: Secondary | ICD-10-CM | POA: Diagnosis not present

## 2023-10-13 ENCOUNTER — Ambulatory Visit (INDEPENDENT_AMBULATORY_CARE_PROVIDER_SITE_OTHER): Admitting: Podiatry

## 2023-10-13 ENCOUNTER — Encounter: Payer: Self-pay | Admitting: Podiatry

## 2023-10-13 VITALS — Ht 62.0 in | Wt 140.0 lb

## 2023-10-13 DIAGNOSIS — B351 Tinea unguium: Secondary | ICD-10-CM | POA: Diagnosis not present

## 2023-10-13 DIAGNOSIS — L603 Nail dystrophy: Secondary | ICD-10-CM

## 2023-10-13 NOTE — Patient Instructions (Signed)
 Place 1/4 cup of epsom salts in a quart of warm tap water.  Submerge your foot or feet in the solution and soak for 20 minutes.  This soak should be done twice a day.  Next, remove your foot or feet from solution, blot dry the affected area. Apply ointment and cover if instructed by your doctor.   IF YOUR SKIN BECOMES IRRITATED WHILE USING THESE INSTRUCTIONS, IT IS OKAY TO SWITCH TO  WHITE VINEGAR AND WATER.  As another alternative soak, you may use antibacterial soap and water.  Monitor for any signs/symptoms of infection. Call the office immediately if any occur or go directly to the emergency room. Call with any questions/concerns.  You may apply a small amount of Neosporin to the area for the first 5 to 7 days.  After that discontinue applying any ointment.  You can continue to soak and bandage until a dry scab starts to form.  Allow toe to air dry before applying Band-Aid.

## 2023-10-13 NOTE — Progress Notes (Signed)
       Subjective:  Patient ID: Zoe House, female    DOB: [birth year],  MRN: [id number]  Zoe House presents to clinic today for:  Chief Complaint  Patient presents with   Nail Problem    " I am  ready to get my nail off today"   Patient presenting for above complaint.  She has been dealing with dystrophic, mycotic appearing right first toenail.  It does cause her discomfort as the nail grows out.  She would like the toenail removed.  PCP is Kotturi, Zadie Rhine, MD.  Allergies  Allergen Reactions   Other Other (See Comments)    Pt states that she is allergic to trees, dogs, cats, mold, grass, dust, and 52 other outside sources.  Pt states that she is NOT allergic to any kind of medication or food.      Review of Systems: Negative except as noted in the HPI.  Objective:  There were no vitals filed for this visit.  Zoe House is a pleasant 23 y.o. female in NAD. AAO x 3.  Vascular Examination: Capillary refill time is 3-5 seconds to toes bilateral. Palpable pedal pulses b/l LE. Digital hair present b/l. No pedal edema b/l. Skin temperature gradient WNL b/l. No varicosities b/l. No cyanosis or clubbing noted b/l.   Dermatological Examination: Right first toenail is thickened, discolored, dystrophic, showing degree of nail plate separation.  Tender on direct dorsal palpation.  No signs of acute bacterial infection  Neurological Examination: Protective sensation intact with Semmes-Weinstein 10 gram monofilament b/l LE. Vibratory sensation intact b/l LE.      No data to display           Assessment/Plan: 1. Onychomycosis   2. Nail dystrophy     No orders of the defined types were placed in this encounter.   Discussed patient's condition today.  She would like to proceed with temporary total nail avulsion of the right first toenail.  We did discuss distinct possibility that the nail could grow back abnormal with some deformity.  Patient expressed good understanding.   After obtaining patient consent, the right hallux was anesthetized with a 50:50 mixture of 1% lidocaine plain and 0.5% bupivacaine plain for a total of 3cc's administered.  Upon confirmation of anesthesia, a freer elevator was utilized to free the right hallux nail plate from the nail bed.  The nail plate was then avulsed proximal to the eponychium and removed in toto.  The area was inspected for any remaining spicules.  No chemical matrixectomy was performed.  Antibiotic ointment and a DSD were applied, followed by a Coban dressing.  Patient tolerated the anesthetic and procedure well and will f/u in 2-3 weeks for recheck.  Patient given post-procedure instructions for daily 20-minute Epsom salt soaks, antibiotic ointment and daily use of Bandaids until toe starts to dry / form eschar.    Return in about 2 weeks (around 10/27/2023) for Nail Check.   Bronwen Betters, DPM, AACFAS [facility]     [address] [place], [place] [address]                Office [phone]  Fax [phone]

## 2023-10-15 DIAGNOSIS — Z419 Encounter for procedure for purposes other than remedying health state, unspecified: Secondary | ICD-10-CM | POA: Diagnosis not present

## 2023-10-18 ENCOUNTER — Telehealth: Payer: Self-pay | Admitting: Podiatry

## 2023-10-18 NOTE — Telephone Encounter (Signed)
error 

## 2023-10-18 NOTE — Telephone Encounter (Signed)
 Dr. Marvis Sluder, Pt would like to know if she can be released back to work as of tomorrow with no restrictions. If so, I will get the letter/form prepared for her. TY

## 2023-10-28 ENCOUNTER — Ambulatory Visit: Admitting: Podiatry

## 2023-10-28 DIAGNOSIS — B351 Tinea unguium: Secondary | ICD-10-CM

## 2023-10-28 NOTE — Progress Notes (Signed)
 Subjective: Zoe House is a 23 y.o.  female returns to office today for follow up evaluation after having right 1st total nail avulsion performed. Patient has been soaking using epsom salt and applying topical antibiotic covered with bandaid daily. Patient denies fevers, chills, nausea, vomiting. Denies any calf pain, chest pain, SOB.   Objective:  Vitals: Reviewed  General: Well developed, nourished, in no acute distress, alert and oriented x3   Dermatology: Skin is warm, dry and supple bilateral. right 1st nail bed appears to be clean, dry, with mild granular tissue and surrounding scab. There is no surrounding erythema, edema, drainage/purulence. The remaining nails appear unremarkable at this time. There are no other lesions or other signs of infection present.  Neurovascular status: Intact. No lower extremity swelling; No pain with calf compression bilateral.  Musculoskeletal: Decreased tenderness to palpation of the right 1st nail bed. Muscular strength within normal limits bilateral.   Assesement and Plan: S/p total nail avulsion, doing well.   -Continue soaking in epsom salts twice a day followed by antibiotic ointment and a band-aid. Can leave uncovered at night. Continue this until completely healed.  -If the area has not healed in 2 weeks, call the office for follow-up appointment, or sooner if any problems arise.  -Monitor for any signs/symptoms of infection. Call the office immediately if any occur or go directly to the emergency room. Call with any questions/concerns.  Jyll Tomaro, DPM

## 2023-11-14 DIAGNOSIS — Z419 Encounter for procedure for purposes other than remedying health state, unspecified: Secondary | ICD-10-CM | POA: Diagnosis not present

## 2023-12-15 DIAGNOSIS — Z419 Encounter for procedure for purposes other than remedying health state, unspecified: Secondary | ICD-10-CM | POA: Diagnosis not present

## 2024-01-03 DIAGNOSIS — F320 Major depressive disorder, single episode, mild: Secondary | ICD-10-CM | POA: Diagnosis not present

## 2024-01-03 DIAGNOSIS — F411 Generalized anxiety disorder: Secondary | ICD-10-CM | POA: Diagnosis not present

## 2024-01-14 DIAGNOSIS — Z419 Encounter for procedure for purposes other than remedying health state, unspecified: Secondary | ICD-10-CM | POA: Diagnosis not present

## 2024-02-14 DIAGNOSIS — Z419 Encounter for procedure for purposes other than remedying health state, unspecified: Secondary | ICD-10-CM | POA: Diagnosis not present

## 2024-03-16 DIAGNOSIS — Z419 Encounter for procedure for purposes other than remedying health state, unspecified: Secondary | ICD-10-CM | POA: Diagnosis not present

## 2024-03-30 DIAGNOSIS — F320 Major depressive disorder, single episode, mild: Secondary | ICD-10-CM | POA: Diagnosis not present

## 2024-03-30 DIAGNOSIS — F411 Generalized anxiety disorder: Secondary | ICD-10-CM | POA: Diagnosis not present

## 2024-04-02 ENCOUNTER — Ambulatory Visit: Admitting: Internal Medicine

## 2024-04-03 NOTE — Patient Instructions (Signed)
 Asthma Continue Symbicort  80-2 puffs once a day with a spacer to prevent cough or wheeze For asthma flare, begin Symbicort  80-2 puffs twice a day for 1 to 2 weeks or until cough and wheeze free, then return to original dosing  Allergic rhinitis Continue allergen avoidance measures directed toward grass pollen, weed pollen, tree pollen, mold, dust mite, cat, and dog as listed below Begin carbinoxamine 4 mg tablets. Take 2 tablets once or twice a day if needed for nasal symptoms Continue Flonase  2 sprays in each nostril once a day if needed for stuffy nose.  In the right nostril, point the applicator out toward the right ear. In the left nostril, point the applicator out toward the left ear Continue azelastine  2 sprays in each nostril up to twice a day if needed for runny nose Consider saline nasal rinses as needed for nasal symptoms. Use this before any medicated nasal sprays for best result Consider updating your environmental allergy skin testing.  Remember to stop your antihistamines for 3 days before your skin testing appointment  Atopic dermatitis Do a daily soaking tub bath in warm water for 10-15 minutes.  Use a gentle, unscented cleanser at the end of the bath (such as Dove unscented bar or baby wash, or Aveeno sensitive body wash). Then rinse, pat half-way dry, and apply a gentle, unscented moisturizer cream or ointment (Cerave, Cetaphil, Eucerin, Aveeno)  all over while still damp. Dry skin makes the itching and rash of eczema worse. The skin should be moisturized with a gentle, unscented moisturizer at least twice daily.  Use only unscented liquid laundry detergent. Apply prescribed topical steroid (mometasone  0.1% below neck) to flared areas (red and thickened eczema) after the moisturizer has soaked into the skin (wait at least 30 minutes). Taper off the topical steroids as the skin improves. Do not use topical steroid for more than 7-10 days at a time.   Call the clinic if this  treatment plan is not working well for you.    Follow up in 6 months or sooner if needed.  Reducing Pollen Exposure The American Academy of All[employer], Ast[employer] suggests the following steps to reduce your exposure to pollen during allergy seasons. Do not hang sheets or clothing out to dry; pollen may collect on these items. Do not mow lawns or spend time around freshly cut grass; mowing stirs up pollen. Keep windows closed at night.  Keep car windows closed while driving. Minimize morning activities outdoors, a time when pollen counts are usually at their highest. Stay indoors as much as possible when pollen counts or humidity is high and on windy days when pollen tends to remain in the air longer. Use air conditioning when possible.  Many air conditioners have filters that trap the pollen spores. Use a HEPA room air filter to remove pollen form the indoor air you breathe.  Control of Mold Allergen Mold and fungi can grow on a variety of surfaces provided certain temperature and moisture conditions exist.  Outdoor molds grow on plants, decaying vegetation and soil.  The major outdoor mold, Alternaria and Cladosporium, are found in very high numbers during hot and dry conditions.  Generally, a late Summer - Fall peak is seen for common outdoor fungal spores.  Rain will temporarily lower outdoor mold spore count, but counts rise rapidly when the rainy period ends.  The most important indoor molds are Aspergillus and Penicillium.  Dark, humid and poorly ventilated basements are ideal sites for mold growth.  The next most common sites of mold growth are the bathroom and the kitchen.  Outdoor [employer] Use air conditioning and keep windows closed Avoid exposure to decaying vegetation. Avoid leaf raking. Avoid grain handling. Consider wearing a face mask if working in moldy areas.  Indoor Mold Control Maintain humidity below 50%. Clean washable surfaces with 5% bleach  solution. Remove sources e.g. Contaminated carpets.   Control of Dust Mite Allergen Dust mites play a major role in allergic asthma and rhinitis. They occur in environments with high humidity wherever human skin is found. Dust mites absorb humidity from the atmosphere (ie, they do not drink) and feed on organic matter (including shed human and animal skin). Dust mites are a microscopic type of insect that you cannot see with the naked eye. High levels of dust mites have been detected from mattresses, pillows, carpets, upholstered furniture, bed covers, clothes, soft toys and any woven material. The principal allergen of the dust mite is found in its feces. A gram of dust may contain 1,000 mites and 250,000 fecal particles. Mite antigen is easily measured in the air during house cleaning activities. Dust mites do not bite and do not cause harm to humans, other than by triggering allergies/asthma.  Ways to decrease your exposure to dust mites in your home:  1. Encase mattresses, box springs and pillows with a mite-impermeable barrier or cover  2. Wash sheets, blankets and drapes weekly in hot water (130 F) with detergent and dry them in a dryer on the hot setting.  3. Have the room cleaned frequently with a vacuum cleaner and a damp dust-mop. For carpeting or rugs, vacuuming with a vacuum cleaner equipped with a high-efficiency particulate air (HEPA) filter. The dust mite allergic individual should not be in a room which is being cleaned and should wait 1 hour after cleaning before going into the room.  4. Do not sleep on upholstered furniture (eg, couches).  5. If possible removing carpeting, upholstered furniture and drapery from the home is ideal. Horizontal blinds should be eliminated in the rooms where the person spends the most time (bedroom, study, television room). Washable vinyl, roller-type shades are optimal.  6. Remove all non-washable stuffed toys from the bedroom. Wash stuffed toys  weekly like sheets and blankets above.  7. Reduce indoor humidity to less than 50%. Inexpensive humidity monitors can be purchased at most hardware stores. Do not use a humidifier as can make the problem worse and are not recommended.  Control of Dog or Cat Allergen Avoidance is the best way to manage a dog or cat allergy. If you have a dog or cat and are allergic to dog or cats, consider removing the dog or cat from the home. If you have a dog or cat but don't want to find it a new home, or if your family wants a pet even though someone in the household is allergic, here are some strategies that may help keep symptoms at bay:  Keep the pet out of your bedroom and restrict it to only a few rooms. Be advised that keeping the dog or cat in only one room will not limit the allergens to that room. Don't pet, hug or kiss the dog or cat; if you do, wash your hands with soap and water. High-efficiency particulate air (HEPA) cleaners run continuously in a bedroom or living room can reduce allergen levels over time. Regular use of a high-efficiency vacuum cleaner or a central vacuum can reduce allergen levels. Giving  your dog or cat a bath at least once a week can reduce airborne allergen.

## 2024-04-03 NOTE — Progress Notes (Unsigned)
   [address] [place] [address] Dept: [phone]  FOLLOW UP NOTE  Patient ID: Zoe House, female    DOB: July 27, 2000  Age: 23 y.o. MRN: [id number] Date of Office Visit: 04/04/2024  Assessment  Chief Complaint: No chief complaint on file.  HPI Zoe House is a 23 year old female who presents to the clinic for follow-up visit.  She was last seen in this clinic on 09/19/2023 by Dr. Tobie for evaluation of asthma, allergic rhinitis, and atopic dermatitis.  She received allergen immunotherapy between 2018 and 2021 without reaching maintenance due inconsistent injections.  Her last environmental allergy skin testing was prior to 2016.  Discussed the use of AI scribe software for clinical note transcription with the patient, who gave verbal consent to proceed.  History of Present Illness      Drug Allergies:  Allergies  Allergen Reactions   Other Other (See Comments)    Pt states that she is allergic to trees, dogs, cats, mold, grass, dust, and 52 other outside sources.  Pt states that she is NOT allergic to any kind of medication or food.      Physical Exam: There were no vitals taken for this visit.   Physical Exam  Diagnostics:    Assessment and Plan: No diagnosis found.  No orders of the defined types were placed in this encounter.   There are no Patient Instructions on file for this visit.  No follow-ups on file.    Thank you for the opportunity to care for this patient.  Please do not hesitate to contact me with questions.  Arlean Mutter, FNP [facility]

## 2024-04-04 ENCOUNTER — Other Ambulatory Visit: Payer: Self-pay

## 2024-04-04 ENCOUNTER — Telehealth: Payer: Self-pay

## 2024-04-04 ENCOUNTER — Ambulatory Visit (INDEPENDENT_AMBULATORY_CARE_PROVIDER_SITE_OTHER): Admitting: Family Medicine

## 2024-04-04 ENCOUNTER — Encounter: Payer: Self-pay | Admitting: Family Medicine

## 2024-04-04 VITALS — BP 102/80 | HR 95 | Temp 98.3°F | Resp 18 | Ht 62.0 in | Wt 158.2 lb

## 2024-04-04 DIAGNOSIS — J4540 Moderate persistent asthma, uncomplicated: Secondary | ICD-10-CM

## 2024-04-04 DIAGNOSIS — J3089 Other allergic rhinitis: Secondary | ICD-10-CM | POA: Diagnosis not present

## 2024-04-04 DIAGNOSIS — L2089 Other atopic dermatitis: Secondary | ICD-10-CM | POA: Diagnosis not present

## 2024-04-04 DIAGNOSIS — J302 Other seasonal allergic rhinitis: Secondary | ICD-10-CM

## 2024-04-04 MED ORDER — CARBINOXAMINE MALEATE 4 MG PO TABS: ORAL_TABLET | ORAL | 5 refills | Status: DC

## 2024-04-04 MED ORDER — CARBINOXAMINE MALEATE 6 MG PO TABS: ORAL_TABLET | ORAL | 3 refills | Status: DC

## 2024-04-04 NOTE — Telephone Encounter (Signed)
 Denied. Per the health plan's preferred drug list, at least 2 preferred drugs must be tried before requesting this drug or tell us  why the member cannot try any preferred alternatives. Please send us  supporting chart notes and lab results. Here is list of preferred alternatives: carbinoxamine solution, cyproheptadine syrup / tablet, hydroxyzine capsule / solution / tablet. Note: Some preferred drug(s) may have quantity limits. Refer to the health plan's preferred drug list for additional details.

## 2024-04-04 NOTE — Telephone Encounter (Signed)
*  AA  Pharmacy Patient Advocate Encounter   Received notification from Fax that prior authorization for Carbinoxamine Maleate 4MG  tablets   is required/requested.   Insurance verification completed.   The patient is insured through [facility].   Per test claim: PA required; PA submitted to above mentioned insurance via Latent Key/confirmation #/EOC [id number] Status is pending

## 2024-04-05 NOTE — Telephone Encounter (Signed)
 Well ok. Can you please order fexofenadine  180 mg once a day if needed for allergy symptom control. She may take an additional dose once a day if needed. Please order 60 each per 60 days so she has enough to get 2 a day if needed. Thank you. Please let the patient know of the change. Thank you

## 2024-04-06 NOTE — Telephone Encounter (Signed)
 I tried calling the patient to inform and verify pharmacy but per the recording the call could not be completed at this time.

## 2024-04-09 MED ORDER — FEXOFENADINE HCL 180 MG PO TABS: 180.0000 mg | ORAL_TABLET | Freq: Two times a day (BID) | ORAL | 5 refills | Status: AC | PRN

## 2024-04-09 NOTE — Addendum Note (Signed)
 Addended by: Yurika Pereda E on: 04/09/2024 11:23 AM   Modules accepted: Orders

## 2024-04-09 NOTE — Telephone Encounter (Signed)
 Call can not be completed as dial. So I couldn't reach patient.

## 2024-05-14 DIAGNOSIS — J00 Acute nasopharyngitis [common cold]: Secondary | ICD-10-CM | POA: Diagnosis not present

## 2024-05-14 DIAGNOSIS — Z20822 Contact with and (suspected) exposure to covid-19: Secondary | ICD-10-CM | POA: Diagnosis not present

## 2024-05-15 DIAGNOSIS — H1032 Unspecified acute conjunctivitis, left eye: Secondary | ICD-10-CM | POA: Diagnosis not present

## 2024-06-19 DIAGNOSIS — F320 Major depressive disorder, single episode, mild: Secondary | ICD-10-CM | POA: Diagnosis not present

## 2024-06-19 DIAGNOSIS — F411 Generalized anxiety disorder: Secondary | ICD-10-CM | POA: Diagnosis not present

## 2024-07-24 ENCOUNTER — Telehealth: Payer: Self-pay | Admitting: Family Medicine

## 2024-07-24 MED ORDER — BUDESONIDE-FORMOTEROL FUMARATE 80-4.5 MCG/ACT IN AERO: INHALATION_SPRAY | RESPIRATORY_TRACT | 1 refills | Status: AC

## 2024-07-24 MED ORDER — MOMETASONE FUROATE 0.1 % EX CREA: TOPICAL_CREAM | CUTANEOUS | 3 refills | Status: AC

## 2024-07-24 NOTE — Telephone Encounter (Signed)
 PT called stating she needs refills for the Symbicort  and the Mometasone  cream to send the scripts to her pharmacy.

## 2024-07-30 ENCOUNTER — Telehealth: Payer: Self-pay

## 2024-07-30 ENCOUNTER — Other Ambulatory Visit (HOSPITAL_COMMUNITY): Payer: Self-pay

## 2024-07-30 NOTE — Telephone Encounter (Signed)
*  AA  Pharmacy Patient Advocate Encounter   Received notification from Fax that prior authorization for Breyna  is required/requested.   Insurance verification completed.   The patient is insured through [facility] MEDICAID.   Per test claim:  Brand Symbicort  is preferred by the insurance.  If suggested medication is appropriate, Please send in a new RX and discontinue this one. If not, please advise as to why it's not appropriate so that we may request a Prior Authorization. Please note, some preferred medications may still require a PA.  If the suggested medications have not been trialed and there are no contraindications to their use, the PA will not be submitted, as it will not be approved. Archived Key: N/A

## 2024-10-01 ENCOUNTER — Ambulatory Visit: Admitting: Family Medicine
